# Patient Record
Sex: Female | Born: 1960 | Race: White | Hispanic: No | Marital: Married | Smoking: Never smoker
Health system: Southern US, Community
[De-identification: ages and names within clinical notes are randomized; demographics above are authoritative.]

## PROBLEM LIST (undated history)

## (undated) DIAGNOSIS — E785 Hyperlipidemia, unspecified: Secondary | ICD-10-CM

## (undated) DIAGNOSIS — R51 Headache: Secondary | ICD-10-CM

## (undated) DIAGNOSIS — K219 Gastro-esophageal reflux disease without esophagitis: Secondary | ICD-10-CM

## (undated) DIAGNOSIS — N83209 Unspecified ovarian cyst, unspecified side: Secondary | ICD-10-CM

## (undated) DIAGNOSIS — E079 Disorder of thyroid, unspecified: Secondary | ICD-10-CM

## (undated) HISTORY — DX: Hyperlipidemia, unspecified: E78.5

## (undated) HISTORY — PX: TUBAL LIGATION: SHX77

## (undated) HISTORY — DX: Unspecified ovarian cyst, unspecified side: N83.209

## (undated) HISTORY — DX: Disorder of thyroid, unspecified: E07.9

---

## 2001-04-29 ENCOUNTER — Other Ambulatory Visit: Admission: RE | Admit: 2001-04-29 | Discharge: 2001-04-29 | Payer: Self-pay | Admitting: Obstetrics and Gynecology

## 2003-04-26 ENCOUNTER — Other Ambulatory Visit: Admission: RE | Admit: 2003-04-26 | Discharge: 2003-04-26 | Payer: Self-pay | Admitting: Family Medicine

## 2004-03-08 ENCOUNTER — Other Ambulatory Visit: Admission: RE | Admit: 2004-03-08 | Discharge: 2004-03-08 | Payer: Self-pay | Admitting: Family Medicine

## 2005-03-10 ENCOUNTER — Other Ambulatory Visit: Admission: RE | Admit: 2005-03-10 | Discharge: 2005-03-10 | Payer: Self-pay | Admitting: Family Medicine

## 2012-07-20 ENCOUNTER — Other Ambulatory Visit: Payer: Self-pay

## 2012-07-20 DIAGNOSIS — Z1211 Encounter for screening for malignant neoplasm of colon: Secondary | ICD-10-CM

## 2012-07-21 ENCOUNTER — Telehealth: Payer: Self-pay

## 2012-07-21 DIAGNOSIS — Z1211 Encounter for screening for malignant neoplasm of colon: Secondary | ICD-10-CM

## 2012-07-21 MED ORDER — SOD PICOSULFATE-MAG OX-CIT ACD 10-3.5-12 MG-GM-GM PO PACK: 1.0000 | PACK | Freq: Once | ORAL | Status: DC

## 2012-07-21 NOTE — Telephone Encounter (Signed)
PREPOPIK-DRINK WATER TO KEEP URINE LIGHT YELLOW.  PT SHOULD DROP OFF RX 3 DAYS PRIOR TO PROCEDURE.  

## 2012-07-21 NOTE — Telephone Encounter (Signed)
Gastroenterology Pre-Procedure Form    Request Date: 07/20/2012    Requesting Physician: Pt just called to schedule/ Goes to [facility] in [place]     PATIENT INFORMATION:  Pamela [name] is a 52 y.o., female (DOB=03/15/1960).  PROCEDURE: Procedure(s) requested: colonoscopy Procedure Reason: screening for colon cancer  PATIENT REVIEW QUESTIONS: The patient reports the following:   1. Diabetes Melitis: no 2. Joint replacements in the past 12 months: no 3. Major health problems in the past 3 months: no 4. Has an artificial valve or MVP:no 5. Has been advised in past to take antibiotics in advance of a procedure like teeth cleaning: no}    MEDICATIONS & ALLERGIES:    Patient reports the following regarding taking any blood thinners:   Plavix? no Aspirin?no Coumadin?  no  Patient confirms/reports the following medications:  Current Outpatient Prescriptions  Medication Sig Dispense Refill  . ibuprofen (ADVIL,MOTRIN) 200 MG tablet Take 200 mg by mouth every 6 (six) hours as needed for pain.      . Multiple Vitamin (MULTIVITAMIN) tablet Take 1 tablet by mouth daily.       No current facility-administered medications for this visit.    Patient confirms/reports the following allergies:  Allergies  Allergen Reactions  . Iodine Hives    Patient is appropriate to schedule for requested procedure(s): yes  AUTHORIZATION INFORMATION Primary Insurance:   ID #:   Group #:  Pre-Cert / Auth required: Pre-Cert / Auth #:   Secondary Insurance:   ID #:   Group #:  Pre-Cert / Auth required:  Pre-Cert / Auth #:   No orders of the defined types were placed in this encounter.    SCHEDULE INFORMATION: Procedure has been scheduled as follows:  Date: 07/30/2012     Time: 3:00 AM  Location: [facility] Short Stay  This Gastroenterology Pre-Precedure Form is being routed to the following provider(s) for review: Jonette Eva, MD

## 2012-07-21 NOTE — Telephone Encounter (Signed)
Rx was sent to the pharmacy and instructions mailed to pt.  

## 2012-07-23 ENCOUNTER — Encounter (HOSPITAL_COMMUNITY): Payer: Self-pay | Admitting: Pharmacy Technician

## 2012-07-30 ENCOUNTER — Encounter (HOSPITAL_COMMUNITY): Payer: Self-pay | Admitting: *Deleted

## 2012-07-30 ENCOUNTER — Encounter (HOSPITAL_COMMUNITY)
Admission: RE | Disposition: A | Discharge status: Home / Self Care | Payer: Self-pay | Source: Ambulatory Visit | Attending: Gastroenterology

## 2012-07-30 ENCOUNTER — Ambulatory Visit (HOSPITAL_COMMUNITY)
Admission: RE | Admit: 2012-07-30 | Discharge: 2012-07-30 | Disposition: A | Discharge status: Home / Self Care | Payer: PRIVATE HEALTH INSURANCE | Source: Ambulatory Visit | Attending: Gastroenterology | Admitting: Gastroenterology

## 2012-07-30 DIAGNOSIS — K648 Other hemorrhoids: Secondary | ICD-10-CM | POA: Insufficient documentation

## 2012-07-30 DIAGNOSIS — D126 Benign neoplasm of colon, unspecified: Secondary | ICD-10-CM

## 2012-07-30 DIAGNOSIS — Z1211 Encounter for screening for malignant neoplasm of colon: Secondary | ICD-10-CM | POA: Insufficient documentation

## 2012-07-30 HISTORY — DX: Headache: R51

## 2012-07-30 HISTORY — PX: COLONOSCOPY: SHX5424

## 2012-07-30 HISTORY — DX: Gastro-esophageal reflux disease without esophagitis: K21.9

## 2012-07-30 SURGERY — COLONOSCOPY
Anesthesia: Moderate Sedation

## 2012-07-30 MED ORDER — SODIUM CHLORIDE 0.9 % IV SOLN
INTRAVENOUS | Status: DC
  Administered 2012-07-30: 12:00:00 via INTRAVENOUS

## 2012-07-30 MED ORDER — STERILE WATER FOR IRRIGATION IR SOLN
Status: DC | PRN
  Administered 2012-07-30: 13:00:00

## 2012-07-30 MED ORDER — MEPERIDINE HCL 100 MG/ML IJ SOLN
INTRAMUSCULAR | Status: DC | PRN
  Administered 2012-07-30 (×3): 25 mg via INTRAVENOUS

## 2012-07-30 MED ORDER — MEPERIDINE HCL 100 MG/ML IJ SOLN
INTRAMUSCULAR | Status: AC
  Filled 2012-07-30: qty 2

## 2012-07-30 MED ORDER — MIDAZOLAM HCL 5 MG/5ML IJ SOLN
INTRAMUSCULAR | Status: AC
  Filled 2012-07-30: qty 10

## 2012-07-30 MED ORDER — MIDAZOLAM HCL 5 MG/5ML IJ SOLN
INTRAMUSCULAR | Status: DC | PRN
  Administered 2012-07-30 (×2): 2 mg via INTRAVENOUS
  Administered 2012-07-30: 1 mg via INTRAVENOUS

## 2012-07-30 NOTE — H&P (Signed)
  Primary Care Physician:  No primary provider on file. Primary Gastroenterologist:  Dr. Darrick Penna  Pre-Procedure History & Physical: HPI:  Pamela [name] is a 52 y.o. female here for COLON CANCER SCREENING.   Past Medical History  Diagnosis Date  . GERD (gastroesophageal reflux disease)     heartburn  . Headache(784.0)     migraines    Past Surgical History  Procedure Laterality Date  . Tubal ligation      Prior to Admission medications   Medication Sig Start Date End Date Taking? Authorizing Provider  ibuprofen (ADVIL,MOTRIN) 200 MG tablet Take 200 mg by mouth every 6 (six) hours as needed for pain.   Yes Historical Provider, MD  Multiple Vitamin (MULTIVITAMIN) tablet Take 1 tablet by mouth daily.   Yes Historical Provider, MD  Sod Picosulfate-Mag Ox-Cit Acd 10-3.5-12 MG-GM-GM PACK Take 1 kit by mouth once. 07/21/12  Yes [place] Bali, MD    Allergies as of 07/20/2012  . (Not on File)    History reviewed. No pertinent family history.  History   Social History  . Marital Status: Married    Spouse Name: N/A    Number of Children: N/A  . Years of Education: N/A   Occupational History  . Not on file.   Social History Main Topics  . Smoking status: Never Smoker   . Smokeless tobacco: Not on file  . Alcohol Use: No  . Drug Use: No  . Sexually Active: Yes   Other Topics Concern  . Not on file   Social History Narrative  . No narrative on file    Review of Systems: See HPI, otherwise negative ROS   Physical Exam: BP 147/92  Pulse 90  Temp(Src) 97.6 F (36.4 C) (Oral)  Resp 12  Ht 5\' 1"  (1.549 m)  Wt 150 lb (68.04 kg)  BMI 28.36 kg/m2 General:   Alert,  pleasant and cooperative in NAD Head:  Normocephalic and atraumatic. Neck:  Supple; Lungs:  Clear throughout to auscultation.    Heart:  Regular rate and rhythm. Abdomen:  Soft, nontender and nondistended. Normal bowel sounds, without guarding, and without rebound.   Neurologic:  Alert and  oriented x4;   grossly normal neurologically.  Impression/Plan:     SCREENING  Plan:  1. TCS TODAY

## 2012-07-30 NOTE — Op Note (Addendum)
[facility] [address] [place], [address]   COLONOSCOPY PROCEDURE REPORT  PATIENT: Pamela, [name]  MR#: [id number] BIRTHDATE: 1960-07-05 , 52  yrs. old GENDER: Female ENDOSCOPIST: Jonette Eva, MD REFERRED IO:NGEX Sheron Nightingale, N.P. PROCEDURE DATE:  07/30/2012 PROCEDURE:   Colonoscopy with snare polypectomy INDICATIONS:Average risk patient for colon cancer. MEDICATIONS: Demerol 75 mg IV and Versed 5 mg IV  DESCRIPTION OF PROCEDURE:    Physical exam was performed.  Informed consent was obtained from the patient after explaining the benefits, risks, and alternatives to procedure.  The patient was connected to monitor and placed in left lateral position. Continuous oxygen was provided by nasal cannula and IV medicine administered through an indwelling cannula.  After administration of sedation and rectal exam, the patients rectum was intubated and the EC-[id number] ([id number])  colonoscope was advanced under direct visualization to the ileum.  The scope was removed slowly by carefully examining the color, texture, anatomy, and integrity mucosa on the way out.  The patient was recovered in endoscopy and discharged home in satisfactory condition.    COLON FINDINGS: The mucosa appeared normal in the terminal ileum.  , Three sessile polyps measuring 4-6 mm in size were found in the ascending colon, at the hepatic flexure, and in the transverse colon.  A polypectomy was performed using snare cautery.  The resection was complete and the polyp tissue was completely retrieved, Small internal hemorrhoids were found.  , and The colon was otherwise normal.  There was no diverticulosis, inflammation, or cancers unless previously stated.  PREP QUALITY: good.    CECAL W/D TIME: 15 minutes  COMPLICATIONS: None  ENDOSCOPIC IMPRESSION: 1.   Normal mucosa in the terminal ileum 2.   Three COLON polyps REMOVED 3.   Small internal hemorrhoids  RECOMMENDATIONS: AWAIT BIOPSY HIGH  FIBER DIET TCS IN 3 YEARS IF 3 SIMPLE ADENOMAS AND 5-10 YEARS IF 1-2 SIMPLE ADENOMAS       _______________________________ Rosalie DoctorJonette Eva, MD 08/04/2012 2:48 AM Revised: 08/04/2012 2:48 AM    PATIENT NAME:  Pamela, [name] MR#: [id number]

## 2012-08-04 ENCOUNTER — Encounter (HOSPITAL_COMMUNITY): Payer: Self-pay | Admitting: Gastroenterology

## 2012-08-04 ENCOUNTER — Telehealth: Payer: Self-pay | Admitting: Gastroenterology

## 2012-08-04 NOTE — Telephone Encounter (Signed)
No PCP on File 

## 2012-08-04 NOTE — Telephone Encounter (Signed)
Please call pt. She had simple adenomas removed from her colon.   FOLLOW A HIGH FIBER DIET.  NEXT COLONOSCOPY IN 3 YEARS.

## 2012-08-04 NOTE — Telephone Encounter (Signed)
Called. Many rings and no answer.  

## 2012-08-06 NOTE — Telephone Encounter (Signed)
Letter mailed to pt to call for results.  

## 2012-08-09 ENCOUNTER — Telehealth: Payer: Self-pay | Admitting: Gastroenterology

## 2012-08-09 NOTE — Telephone Encounter (Signed)
Pt called to speak with nurse. She received a letter regarding her results. Please call patient back at [phone]

## 2012-08-09 NOTE — Telephone Encounter (Signed)
See results of 08/04/2012. Pt informed.

## 2012-08-10 ENCOUNTER — Ambulatory Visit (INDEPENDENT_AMBULATORY_CARE_PROVIDER_SITE_OTHER): Payer: PRIVATE HEALTH INSURANCE | Admitting: Nurse Practitioner

## 2012-08-10 ENCOUNTER — Encounter: Payer: Self-pay | Admitting: Nurse Practitioner

## 2012-08-10 VITALS — BP 139/92 | HR 91 | Temp 97.1°F | Ht 60.0 in | Wt 171.0 lb

## 2012-08-10 DIAGNOSIS — N390 Urinary tract infection, site not specified: Secondary | ICD-10-CM

## 2012-08-10 DIAGNOSIS — Z0000 Encounter for general adult medical examination without abnormal findings: Secondary | ICD-10-CM

## 2012-08-10 DIAGNOSIS — Z01419 Encounter for gynecological examination (general) (routine) without abnormal findings: Secondary | ICD-10-CM

## 2012-08-10 LAB — POCT CBC
Granulocyte percent: 70.8 %G (ref 37–80)
HCT, POC: 32.3 % — AB (ref 37.7–47.9)
Hemoglobin: 11.7 g/dL — AB (ref 12.2–16.2)
Lymph, poc: 1.2 (ref 0.6–3.4)
MCH, POC: 31.8 pg — AB (ref 27–31.2)
MCHC: 36.2 g/dL — AB (ref 31.8–35.4)
MCV: 87.9 fL (ref 80–97)
MPV: 7.9 fL (ref 0–99.8)
POC Granulocyte: 3.8 (ref 2–6.9)
POC LYMPH PERCENT: 23 %L (ref 10–50)
Platelet Count, POC: 224 10*3/uL (ref 142–424)
RBC: 3.7 M/uL — AB (ref 4.04–5.48)
RDW, POC: 13.2 %
WBC: 5.3 10*3/uL (ref 4.6–10.2)

## 2012-08-10 LAB — POCT UA - MICROSCOPIC ONLY
Casts, Ur, LPF, POC: NEGATIVE
Crystals, Ur, HPF, POC: NEGATIVE
Mucus, UA: NEGATIVE
Yeast, UA: NEGATIVE

## 2012-08-10 LAB — POCT URINALYSIS DIPSTICK
Bilirubin, UA: NEGATIVE
Glucose, UA: NEGATIVE
Ketones, UA: NEGATIVE
Nitrite, UA: NEGATIVE
Spec Grav, UA: 1.025
Urobilinogen, UA: NEGATIVE
pH, UA: 5

## 2012-08-10 NOTE — Patient Instructions (Signed)

## 2012-08-10 NOTE — Progress Notes (Signed)
  Subjective:    Patient ID: Pamela [name], female    DOB: 1960-09-19, 52 y.o.   MRN: [id number]  HPI Patient in today for a CPE and PAP- SHe has no current medical problems and is on no meds- No complaints today.    Review of Systems  All other systems reviewed and are negative.       Objective:   Physical Exam  Constitutional: She is oriented to person, place, and time. She appears well-developed and well-nourished.  HENT:  Head: Normocephalic.  Right Ear: Hearing, tympanic membrane, external ear and ear canal normal.  Left Ear: Hearing, tympanic membrane, external ear and ear canal normal.  Nose: Nose normal.  Mouth/Throat: Uvula is midline and oropharynx is clear and moist.  Eyes: Conjunctivae and EOM are normal. Pupils are equal, round, and reactive to light.  Neck: Normal range of motion and full passive range of motion without pain. Neck supple. No JVD present. Carotid bruit is not present. No mass and no thyromegaly present.  Cardiovascular: Normal rate, normal heart sounds and intact distal pulses.   No murmur heard. Pulmonary/Chest: Effort normal and breath sounds normal. Right breast exhibits no inverted nipple, no mass, no nipple discharge, no skin change and no tenderness. Left breast exhibits no inverted nipple, no mass, no nipple discharge, no skin change and no tenderness.  Abdominal: Soft. Bowel sounds are normal. She exhibits no mass. There is no tenderness.  Genitourinary: Vagina normal and uterus normal. No breast swelling, tenderness, discharge or bleeding.  bimanual exam-No adnexal masses or tenderness. Cervix parous and pink  Musculoskeletal: Normal range of motion.  Lymphadenopathy:    She has no cervical adenopathy.  Neurological: She is alert and oriented to person, place, and time.  Skin: Skin is warm and dry.  Psychiatric: She has a normal mood and affect. Her behavior is normal. Judgment and thought content normal.   BP 139/92  Pulse 91  Temp(Src) 97.1  F (36.2 C) (Oral)  Ht 5' (1.524 m)  Wt 171 lb (77.565 kg)  BMI 33.4 kg/m2        Assessment & Plan:   1. Annual physical exam   2. Encounter for routine gynecological examination    Orders Placed This Encounter  Procedures  . COMPLETE METABOLIC PANEL WITH GFR  . NMR Lipoprofile with Lipids  . Thyroid Panel With TSH  . POCT UA - Microscopic Only  . POCT CBC   Diet and exercise encouraged Labs pending RTO prn  Mary-Margaret Daphine Deutscher, FNP

## 2012-08-11 LAB — COMPLETE METABOLIC PANEL WITH GFR
ALT: 27 U/L (ref 0–35)
AST: 22 U/L (ref 0–37)
Albumin: 3.9 g/dL (ref 3.5–5.2)
Alkaline Phosphatase: 80 U/L (ref 39–117)
BUN: 12 mg/dL (ref 6–23)
CO2: 28 mEq/L (ref 19–32)
Calcium: 9.2 mg/dL (ref 8.4–10.5)
Chloride: 105 mEq/L (ref 96–112)
Creat: 0.66 mg/dL (ref 0.50–1.10)
GFR, Est African American: >89 mL/min
GFR, Est Non African American: >89 mL/min
Glucose, Bld: 72 mg/dL (ref 70–99)
Potassium: 3.2 mEq/L — ABNORMAL LOW (ref 3.5–5.3)
Sodium: 142 mEq/L (ref 135–145)
Total Bilirubin: 0.4 mg/dL (ref 0.3–1.2)
Total Protein: 6.2 g/dL (ref 6.0–8.3)

## 2012-08-11 LAB — NMR LIPOPROFILE WITH LIPIDS
Cholesterol, Total: 180 mg/dL (ref <200)
HDL Particle Number: 30.3 umol/L — ABNORMAL LOW (ref >=30.5)
HDL Size: 9.2 nm (ref >=9.2)
HDL-C: 54 mg/dL (ref >=40)
LDL (calc): 109 mg/dL — ABNORMAL HIGH (ref <100)
LDL Particle Number: 1327 nmol/L — ABNORMAL HIGH (ref <1000)
LDL Size: 21.2 nm (ref >20.5)
LP-IR Score: 30 (ref <=45)
Large HDL-P: 8.2 umol/L (ref >=4.8)
Large VLDL-P: 2.2 nmol/L (ref <=2.7)
Small LDL Particle Number: 336 nmol/L (ref <=527)
Triglycerides: 87 mg/dL (ref <150)
VLDL Size: 43.6 nm (ref <=46.6)

## 2012-08-11 LAB — PAP IG, CT-NG, RFX HPV ASCU
Chlamydia Probe Amp: NEGATIVE
GC Probe Amp: NEGATIVE

## 2012-08-11 LAB — THYROID PANEL WITH TSH
Free Thyroxine Index: 3.2 (ref 1.0–3.9)
T3 Uptake: 37.4 % — ABNORMAL HIGH (ref 22.5–37.0)
T4, Total: 8.5 ug/dL (ref 5.0–12.5)
TSH: 3.914 u[IU]/mL (ref 0.350–4.500)

## 2012-08-12 ENCOUNTER — Other Ambulatory Visit: Payer: Self-pay | Admitting: General Practice

## 2012-08-12 DIAGNOSIS — E876 Hypokalemia: Secondary | ICD-10-CM

## 2012-08-12 MED ORDER — POTASSIUM CHLORIDE CRYS ER 10 MEQ PO TBCR: 10.0000 meq | EXTENDED_RELEASE_TABLET | Freq: Two times a day (BID) | ORAL | Status: DC

## 2012-08-20 ENCOUNTER — Ambulatory Visit (INDEPENDENT_AMBULATORY_CARE_PROVIDER_SITE_OTHER): Payer: PRIVATE HEALTH INSURANCE | Admitting: Family Medicine

## 2012-08-20 DIAGNOSIS — Z23 Encounter for immunization: Secondary | ICD-10-CM

## 2012-08-20 DIAGNOSIS — R7989 Other specified abnormal findings of blood chemistry: Secondary | ICD-10-CM

## 2012-08-20 LAB — BASIC METABOLIC PANEL WITH GFR
BUN: 10 mg/dL (ref 6–23)
CO2: 29 mEq/L (ref 19–32)
Calcium: 10 mg/dL (ref 8.4–10.5)
Chloride: 106 mEq/L (ref 96–112)
Creat: 0.76 mg/dL (ref 0.50–1.10)
GFR, Est African American: >89 mL/min
GFR, Est Non African American: >89 mL/min
Glucose, Bld: 99 mg/dL (ref 70–99)
Potassium: 3.8 mEq/L (ref 3.5–5.3)
Sodium: 141 mEq/L (ref 135–145)

## 2012-08-24 ENCOUNTER — Telehealth: Payer: Self-pay | Admitting: Nurse Practitioner

## 2012-08-24 NOTE — Telephone Encounter (Signed)
Pt aware by detailed mess on Vm

## 2014-12-18 ENCOUNTER — Encounter: Payer: Self-pay | Admitting: Family

## 2014-12-18 ENCOUNTER — Ambulatory Visit (INDEPENDENT_AMBULATORY_CARE_PROVIDER_SITE_OTHER): Payer: BLUE CROSS/BLUE SHIELD | Admitting: Family

## 2014-12-18 VITALS — BP 148/94 | HR 74 | Temp 97.9°F | Ht 60.0 in | Wt 173.0 lb

## 2014-12-18 DIAGNOSIS — Z0000 Encounter for general adult medical examination without abnormal findings: Secondary | ICD-10-CM

## 2014-12-18 DIAGNOSIS — I10 Essential (primary) hypertension: Secondary | ICD-10-CM

## 2014-12-18 DIAGNOSIS — Z23 Encounter for immunization: Secondary | ICD-10-CM | POA: Diagnosis not present

## 2014-12-18 DIAGNOSIS — Z01419 Encounter for gynecological examination (general) (routine) without abnormal findings: Secondary | ICD-10-CM | POA: Diagnosis not present

## 2014-12-18 LAB — POCT URINALYSIS DIPSTICK
Bilirubin, UA: NEGATIVE
Glucose, UA: NEGATIVE
Ketones, UA: NEGATIVE
Leukocytes, UA: NEGATIVE
Nitrite, UA: NEGATIVE
Protein, UA: NEGATIVE
Spec Grav, UA: 1.015
Urobilinogen, UA: NEGATIVE
pH, UA: 6

## 2014-12-18 LAB — POCT UA - MICROSCOPIC ONLY
Casts, Ur, LPF, POC: NEGATIVE
Crystals, Ur, HPF, POC: NEGATIVE
WBC, Ur, HPF, POC: NEGATIVE
Yeast, UA: NEGATIVE

## 2014-12-18 MED ORDER — HYDROCHLOROTHIAZIDE 25 MG PO TABS: 25.0000 mg | ORAL_TABLET | Freq: Every day | ORAL | Status: DC

## 2014-12-18 NOTE — Progress Notes (Signed)
Subjective:    Patient ID: Pamela [name], female    DOB: [birth year], 54 y.o.   MRN: [id number]  HPI Pt presents to the office today for CPE with pap. Pt currently not taking any chronic medications. Pt's BP is elevated today. Pt states her BP has never been elevated before, but she is anxious today about her pap. Pt denies any headache, palpitations, SOB, or edema at this time.     Review of Systems  Constitutional: Negative.   HENT: Negative.   Eyes: Negative.   Respiratory: Negative.  Negative for shortness of breath.   Cardiovascular: Negative.  Negative for palpitations.  Gastrointestinal: Negative.   Endocrine: Negative.   Genitourinary: Negative.   Musculoskeletal: Negative.   Neurological: Negative.  Negative for headaches.  Hematological: Negative.   Psychiatric/Behavioral: Negative.   All other systems reviewed and are negative.      Objective:   Physical Exam  Constitutional: She is oriented to person, place, and time. She appears well-developed and well-nourished. No distress.  HENT:  Head: Normocephalic and atraumatic.  Right Ear: External ear normal.  Left Ear: External ear normal.  Nose: Nose normal.  Mouth/Throat: Oropharynx is clear and moist.  Eyes: Pupils are equal, round, and reactive to [name].  Neck: Normal range of motion. Neck supple. No thyromegaly present.  Cardiovascular: Normal rate, regular rhythm, normal heart sounds and intact distal pulses.   No murmur heard. Pulmonary/Chest: Effort normal and breath sounds normal. No respiratory distress. She has no wheezes. Right breast exhibits no inverted nipple, no mass, no nipple discharge, no skin change and no tenderness. Left breast exhibits no inverted nipple, no mass, no nipple discharge, no skin change and no tenderness. Breasts are symmetrical.  Abdominal: Soft. Bowel sounds are normal. She exhibits no distension. There is no tenderness.  Genitourinary: Vagina normal.  Bimanual exam- no adnexal  masses or tenderness, ovaries nonpalpable   Cervix parous and pink- No discharge   Musculoskeletal: Normal range of motion. She exhibits no edema or tenderness.  Neurological: She is alert and oriented to person, place, and time. She has normal reflexes. No cranial nerve deficit.  Skin: Skin is warm and dry.  Psychiatric: She has a normal mood and affect. Her behavior is normal. Judgment and thought content normal.  Vitals reviewed.   BP 148/94 mmHg  Pulse 74  Temp(Src) 97.9 F (36.6 C) (Oral)  Ht 5' (1.524 m)  Wt 173 lb (78.472 kg)  BMI 33.79 kg/m2      Assessment & Plan:  1. Encounter for routine gynecological examination - POCT urinalysis dipstick - POCT UA - Microscopic Only - Pap IG w/ reflex to HPV when ASC-U  2. Annual physical exam - Anemia Profile B - CMP14+EGFR - Lipid panel - Thyroid Panel With TSH - Vit D  25 hydroxy (rtn osteoporosis monitoring) - Pap IG w/ reflex to HPV when ASC-U  3. Essential hypertension -Pt started on HCTZ 25 mg today -Daily blood pressure log given with instructions on how to fill out and told to bring to next visit -Dash diet information given -Exercise encouraged - Stress Management  -Continue current meds -RTO in 2 weeks  - hydrochlorothiazide (HYDRODIURIL) 25 MG tablet; Take 1 tablet (25 mg total) by mouth daily.  Dispense: 90 tablet; Refill: 3   Continue all meds Labs pending Health Maintenance reviewed- Flu vaccine given today, pt has mammogram scheduled this month Diet and exercise encouraged RTO 2 weeks to recheck HTN  Evelina Dun, FNP

## 2014-12-18 NOTE — Patient Instructions (Signed)

## 2014-12-19 ENCOUNTER — Other Ambulatory Visit: Payer: Self-pay | Admitting: Family

## 2014-12-19 DIAGNOSIS — E039 Hypothyroidism, unspecified: Secondary | ICD-10-CM

## 2014-12-19 DIAGNOSIS — I10 Essential (primary) hypertension: Secondary | ICD-10-CM

## 2014-12-19 DIAGNOSIS — E559 Vitamin D deficiency, unspecified: Secondary | ICD-10-CM

## 2014-12-19 DIAGNOSIS — E785 Hyperlipidemia, unspecified: Secondary | ICD-10-CM

## 2014-12-19 LAB — CMP14+EGFR
ALT: 35 IU/L — ABNORMAL HIGH (ref 0–32)
AST: 27 IU/L (ref 0–40)
Albumin/Globulin Ratio: 1.5 (ref 1.1–2.5)
Albumin: 4.1 g/dL (ref 3.5–5.5)
Alkaline Phosphatase: 85 IU/L (ref 39–117)
BUN/Creatinine Ratio: 14 (ref 9–23)
BUN: 10 mg/dL (ref 6–24)
Bilirubin Total: 0.3 mg/dL (ref 0.0–1.2)
CO2: 28 mmol/L (ref 18–29)
Calcium: 9.3 mg/dL (ref 8.7–10.2)
Chloride: 102 mmol/L (ref 97–106)
Creatinine, Ser: 0.74 mg/dL (ref 0.57–1.00)
GFR calc Af Amer: 106 mL/min/{1.73_m2} (ref >59)
GFR calc non Af Amer: 92 mL/min/{1.73_m2} (ref >59)
Globulin, Total: 2.8 g/dL (ref 1.5–4.5)
Glucose: 95 mg/dL (ref 65–99)
Potassium: 4 mmol/L (ref 3.5–5.2)
Sodium: 143 mmol/L (ref 136–144)
Total Protein: 6.9 g/dL (ref 6.0–8.5)

## 2014-12-19 LAB — ANEMIA PROFILE B
Basophils Absolute: 0.1 10*3/uL (ref 0.0–0.2)
Basos: 1 %
EOS (ABSOLUTE): 0.3 10*3/uL (ref 0.0–0.4)
Eos: 5 %
Ferritin: 105 ng/mL (ref 15–150)
Folate: 20 ng/mL (ref >3.0)
Hematocrit: 42.1 % (ref 34.0–46.6)
Hemoglobin: 14 g/dL (ref 11.1–15.9)
Immature Grans (Abs): 0 10*3/uL (ref 0.0–0.1)
Immature Granulocytes: 0 %
Iron Saturation: 26 % (ref 15–55)
Iron: 72 ug/dL (ref 27–159)
Lymphocytes Absolute: 1 10*3/uL (ref 0.7–3.1)
Lymphs: 19 %
MCH: 30.4 pg (ref 26.6–33.0)
MCHC: 33.3 g/dL (ref 31.5–35.7)
MCV: 91 fL (ref 79–97)
Monocytes Absolute: 0.3 10*3/uL (ref 0.1–0.9)
Monocytes: 6 %
Neutrophils Absolute: 3.7 10*3/uL (ref 1.4–7.0)
Neutrophils: 69 %
Platelets: 217 10*3/uL (ref 150–379)
RBC: 4.61 x10E6/uL (ref 3.77–5.28)
RDW: 13.4 % (ref 12.3–15.4)
Retic Ct Pct: 1.1 % (ref 0.6–2.6)
Total Iron Binding Capacity: 282 ug/dL (ref 250–450)
UIBC: 210 ug/dL (ref 131–425)
Vitamin B-12: 436 pg/mL (ref 211–946)
WBC: 5.3 10*3/uL (ref 3.4–10.8)

## 2014-12-19 LAB — VITAMIN D 25 HYDROXY (VIT D DEFICIENCY, FRACTURES): Vit D, 25-Hydroxy: 23.2 ng/mL — ABNORMAL LOW (ref 30.0–100.0)

## 2014-12-19 LAB — LIPID PANEL
Chol/HDL Ratio: 4.5 ratio units — ABNORMAL HIGH (ref 0.0–4.4)
Cholesterol, Total: 254 mg/dL — ABNORMAL HIGH (ref 100–199)
HDL: 56 mg/dL (ref >39)
LDL Calculated: 165 mg/dL — ABNORMAL HIGH (ref 0–99)
Triglycerides: 164 mg/dL — ABNORMAL HIGH (ref 0–149)
VLDL Cholesterol Cal: 33 mg/dL (ref 5–40)

## 2014-12-19 LAB — THYROID PANEL WITH TSH
Free Thyroxine Index: 1.8 (ref 1.2–4.9)
T3 Uptake Ratio: 24 % (ref 24–39)
T4, Total: 7.6 ug/dL (ref 4.5–12.0)
TSH: 4.57 u[IU]/mL — ABNORMAL HIGH (ref 0.450–4.500)

## 2014-12-19 MED ORDER — VITAMIN D (ERGOCALCIFEROL) 1.25 MG (50000 UNIT) PO CAPS: 50000.0000 [IU] | ORAL_CAPSULE | ORAL | Status: DC

## 2014-12-19 MED ORDER — ATORVASTATIN CALCIUM 20 MG PO TABS: 20.0000 mg | ORAL_TABLET | Freq: Every day | ORAL | Status: DC

## 2014-12-19 MED ORDER — LEVOTHYROXINE SODIUM 25 MCG PO TABS: 25.0000 ug | ORAL_TABLET | Freq: Every day | ORAL | Status: DC

## 2014-12-19 NOTE — Progress Notes (Signed)
PATIENT AWARE. WILL PICK UP RXS

## 2014-12-20 LAB — PAP IG W/ RFLX HPV ASCU: PAP Smear Comment: 0

## 2014-12-21 ENCOUNTER — Telehealth: Payer: Self-pay | Admitting: Family

## 2014-12-21 NOTE — Telephone Encounter (Signed)
Reviewed pap results.

## 2015-01-01 ENCOUNTER — Encounter: Payer: Self-pay | Admitting: Family

## 2015-01-01 ENCOUNTER — Ambulatory Visit (INDEPENDENT_AMBULATORY_CARE_PROVIDER_SITE_OTHER): Payer: BLUE CROSS/BLUE SHIELD | Admitting: Family

## 2015-01-01 ENCOUNTER — Encounter (INDEPENDENT_AMBULATORY_CARE_PROVIDER_SITE_OTHER): Payer: Self-pay

## 2015-01-01 VITALS — BP 116/77 | HR 73 | Temp 98.0°F | Ht 60.0 in | Wt 172.4 lb

## 2015-01-01 DIAGNOSIS — I10 Essential (primary) hypertension: Secondary | ICD-10-CM | POA: Diagnosis not present

## 2015-01-01 DIAGNOSIS — G5603 Carpal tunnel syndrome, bilateral upper limbs: Secondary | ICD-10-CM | POA: Diagnosis not present

## 2015-01-01 NOTE — Patient Instructions (Addendum)

## 2015-01-01 NOTE — Progress Notes (Signed)
   Subjective:    Patient ID: Pamela [name], female    DOB: 1961-01-03, 54 y.o.   MRN: [id number]  Pt presents to the office today to recheck HTN. Pt's BP is at goal today! Pt is also complaining of intermittent bilateral hand and arm "tingling and numbness". PT states she does repetitive   motion at work and noticed this pain about a month ago. Hypertension This is a chronic problem. The current episode started more than 1 year ago. The problem has been resolved since onset. The problem is controlled. Pertinent negatives include no anxiety, headaches, palpitations, peripheral edema or shortness of breath. Risk factors for coronary artery disease include dyslipidemia, obesity, post-menopausal state, sedentary lifestyle and family history. Past treatments include diuretics. The current treatment provides significant improvement. There is no history of kidney disease, CAD/MI, CVA, heart failure or a thyroid problem. There is no history of sleep apnea.      Review of Systems  Constitutional: Negative.   HENT: Negative.   Eyes: Negative.   Respiratory: Negative.  Negative for shortness of breath.   Cardiovascular: Negative.  Negative for palpitations.  Gastrointestinal: Negative.   Endocrine: Negative.   Genitourinary: Negative.   Musculoskeletal: Negative.   Neurological: Negative.  Negative for headaches.  Hematological: Negative.   Psychiatric/Behavioral: Negative.   All other systems reviewed and are negative.      Objective:   Physical Exam  Constitutional: She is oriented to person, place, and time. She appears well-developed and well-nourished. No distress.  HENT:  Head: Normocephalic and atraumatic.  Right Ear: External ear normal.  Left Ear: External ear normal.  Nose: Nose normal.  Mouth/Throat: Oropharynx is clear and moist.  Eyes: Pupils are equal, round, and reactive to [name].  Neck: Normal range of motion. Neck supple. No thyromegaly present.  Cardiovascular: Normal rate,  regular rhythm, normal heart sounds and intact distal pulses.   No murmur heard. Pulmonary/Chest: Effort normal and breath sounds normal. No respiratory distress. She has no wheezes.  Abdominal: Soft. Bowel sounds are normal. She exhibits no distension. There is no tenderness.  Musculoskeletal: Normal range of motion. She exhibits no edema or tenderness.  Neurological: She is alert and oriented to person, place, and time. She has normal reflexes. No cranial nerve deficit.  Skin: Skin is warm and dry.  Psychiatric: She has a normal mood and affect. Her behavior is normal. Judgment and thought content normal.  Vitals reviewed.     BP 116/77 mmHg  Pulse 73  Temp(Src) 98 F (36.7 C) (Oral)  Ht 5' (1.524 m)  Wt 172 lb 6.4 oz (78.2 kg)  BMI 33.67 kg/m2     Assessment & Plan:  1. Essential hypertension -Dash diet information given -Exercise encouraged - Stress Management  -Continue current meds - BMP8+EGFR  2. Bilateral carpal tunnel syndrome -PT to wear bilateral wrist brace while working and told pt to wear at night and take motrin for next 7 days until inflammation is resolved -Ice as needed -RTO prn     Continue all meds Labs pending Health Maintenance reviewed- Pt is getting mammogram done today Diet and exercise encouraged RTO 2 months to recheck Thyroid, Cholesterol, and HTN  Evelina Dun, FNP

## 2015-01-02 ENCOUNTER — Other Ambulatory Visit: Payer: Self-pay | Admitting: Family

## 2015-01-02 DIAGNOSIS — E876 Hypokalemia: Secondary | ICD-10-CM

## 2015-01-02 LAB — BMP8+EGFR
BUN/Creatinine Ratio: 18 (ref 9–23)
BUN: 14 mg/dL (ref 6–24)
CO2: 31 mmol/L — ABNORMAL HIGH (ref 18–29)
Calcium: 9.4 mg/dL (ref 8.7–10.2)
Chloride: 95 mmol/L — ABNORMAL LOW (ref 97–106)
Creatinine, Ser: 0.76 mg/dL (ref 0.57–1.00)
GFR calc Af Amer: 103 mL/min/{1.73_m2} (ref >59)
GFR calc non Af Amer: 89 mL/min/{1.73_m2} (ref >59)
Glucose: 88 mg/dL (ref 65–99)
Potassium: 2.7 mmol/L — ABNORMAL LOW (ref 3.5–5.2)
Sodium: 142 mmol/L (ref 136–144)

## 2015-01-02 MED ORDER — POTASSIUM CHLORIDE ER 10 MEQ PO TBCR: 10.0000 meq | EXTENDED_RELEASE_TABLET | Freq: Every day | ORAL | Status: DC

## 2015-01-03 ENCOUNTER — Telehealth: Payer: Self-pay | Admitting: Family

## 2015-01-03 DIAGNOSIS — E876 Hypokalemia: Secondary | ICD-10-CM

## 2015-01-03 NOTE — Telephone Encounter (Signed)
Pt aware of recent lab - BMP

## 2015-01-05 ENCOUNTER — Other Ambulatory Visit: Payer: PRIVATE HEALTH INSURANCE | Admitting: Nurse Practitioner

## 2015-01-08 ENCOUNTER — Other Ambulatory Visit: Payer: PRIVATE HEALTH INSURANCE | Admitting: Nurse Practitioner

## 2015-02-02 ENCOUNTER — Other Ambulatory Visit: Payer: BLUE CROSS/BLUE SHIELD

## 2015-02-02 DIAGNOSIS — E876 Hypokalemia: Secondary | ICD-10-CM

## 2015-02-02 NOTE — Progress Notes (Signed)
Lab only 

## 2015-02-03 LAB — BMP8+EGFR
BUN/Creatinine Ratio: 15 (ref 9–23)
BUN: 11 mg/dL (ref 6–24)
CO2: 30 mmol/L — ABNORMAL HIGH (ref 18–29)
Calcium: 8.8 mg/dL (ref 8.7–10.2)
Chloride: 100 mmol/L (ref 96–106)
Creatinine, Ser: 0.73 mg/dL (ref 0.57–1.00)
GFR calc Af Amer: 108 mL/min/{1.73_m2} (ref >59)
GFR calc non Af Amer: 94 mL/min/{1.73_m2} (ref >59)
Glucose: 121 mg/dL — ABNORMAL HIGH (ref 65–99)
Potassium: 2.9 mmol/L — ABNORMAL LOW (ref 3.5–5.2)
Sodium: 142 mmol/L (ref 134–144)

## 2015-02-07 ENCOUNTER — Telehealth: Payer: Self-pay | Admitting: Family

## 2015-02-07 NOTE — Telephone Encounter (Signed)
Patient aware of instructions for taking potassium and repeating labs.

## 2015-02-13 ENCOUNTER — Other Ambulatory Visit: Payer: BLUE CROSS/BLUE SHIELD

## 2015-02-13 DIAGNOSIS — R799 Abnormal finding of blood chemistry, unspecified: Secondary | ICD-10-CM

## 2015-02-14 LAB — BMP8+EGFR
BUN/Creatinine Ratio: 16 (ref 9–23)
BUN: 11 mg/dL (ref 6–24)
CO2: 26 mmol/L (ref 18–29)
Calcium: 9.4 mg/dL (ref 8.7–10.2)
Chloride: 98 mmol/L (ref 96–106)
Creatinine, Ser: 0.67 mg/dL (ref 0.57–1.00)
GFR calc Af Amer: 115 mL/min/{1.73_m2} (ref >59)
GFR calc non Af Amer: 100 mL/min/{1.73_m2} (ref >59)
Glucose: 78 mg/dL (ref 65–99)
Potassium: 2.8 mmol/L — ABNORMAL LOW (ref 3.5–5.2)
Sodium: 143 mmol/L (ref 134–144)

## 2015-02-15 ENCOUNTER — Other Ambulatory Visit: Payer: Self-pay | Admitting: *Deleted

## 2015-02-15 ENCOUNTER — Telehealth: Payer: Self-pay | Admitting: Family

## 2015-02-15 DIAGNOSIS — E876 Hypokalemia: Secondary | ICD-10-CM

## 2015-02-15 MED ORDER — POTASSIUM CHLORIDE ER 10 MEQ PO TBCR: 40.0000 meq | EXTENDED_RELEASE_TABLET | Freq: Every day | ORAL | Status: DC

## 2015-02-15 NOTE — Telephone Encounter (Signed)
Patient is taking potassium 94meq 2 pills BID. Patients potassium was 2.8

## 2015-02-15 NOTE — Telephone Encounter (Signed)
Reduce hydrochlorothiazide to 12.5 daily Continue potassium 10 mEq 2 twice daily for total of 40 mEq daily Return to clinic in 1 week and recheck blood pressure and BMP May need to change to spironolactone instead of having to take fluid pill and potassium pills at that time

## 2015-02-15 NOTE — Telephone Encounter (Signed)
Reduce HCTZ to 1/2 - follow up in 1 week with Alyse Low - pt aware

## 2015-02-23 ENCOUNTER — Encounter: Payer: Self-pay | Admitting: Family

## 2015-02-23 ENCOUNTER — Ambulatory Visit (INDEPENDENT_AMBULATORY_CARE_PROVIDER_SITE_OTHER): Payer: BLUE CROSS/BLUE SHIELD | Admitting: Family

## 2015-02-23 VITALS — BP 118/80 | HR 70 | Temp 97.3°F | Ht 60.0 in | Wt 175.0 lb

## 2015-02-23 DIAGNOSIS — I10 Essential (primary) hypertension: Secondary | ICD-10-CM

## 2015-02-23 DIAGNOSIS — E876 Hypokalemia: Secondary | ICD-10-CM | POA: Diagnosis not present

## 2015-02-23 NOTE — Progress Notes (Signed)
   Subjective:    Patient ID: Pamela [name], female    DOB: 1961-02-08, 55 y.o.   MRN: [id number]  HPI Pt presents to the office today to recheck K+. Pt's K+ was 2.7 on 01/01/15 and was started on Potassium oral. Pt's K+ was rechecked on 02/02/15 and her K+ was 2.9. Her potassium increased to 40 meq daily.  Then her K+ was 2.8 on 02/13/15 and pt was to continue  Potassium chloride 40 meq daily and her HCTZ was decreased from 25 mg to 12.5 mg. PT states she doing well and denies any palpitations, SOB, or edema at this time.  Pt's BP is WNL today.    Review of Systems  Constitutional: Negative.   HENT: Negative.   Eyes: Negative.   Respiratory: Negative.  Negative for shortness of breath.   Cardiovascular: Negative.  Negative for palpitations.  Gastrointestinal: Negative.   Endocrine: Negative.   Genitourinary: Negative.   Musculoskeletal: Negative.   Neurological: Negative.  Negative for headaches.  Hematological: Negative.   Psychiatric/Behavioral: Negative.   All other systems reviewed and are negative.      Objective:   Physical Exam  Constitutional: She is oriented to person, place, and time. She appears well-developed and well-nourished. No distress.  HENT:  Head: Normocephalic and atraumatic.  Right Ear: External ear normal.  Left Ear: External ear normal.  Nose: Nose normal.  Mouth/Throat: Oropharynx is clear and moist.  Eyes: Pupils are equal, round, and reactive to [name].  Neck: Normal range of motion. Neck supple. No thyromegaly present.  Cardiovascular: Normal rate, regular rhythm, normal heart sounds and intact distal pulses.   No murmur heard. Pulmonary/Chest: Effort normal and breath sounds normal. No respiratory distress. She has no wheezes.  Abdominal: Soft. Bowel sounds are normal. She exhibits no distension. There is no tenderness.  Musculoskeletal: Normal range of motion. She exhibits no edema or tenderness.  Neurological: She is alert and oriented to person,  place, and time. She has normal reflexes. No cranial nerve deficit.  Skin: Skin is warm and dry.  Psychiatric: She has a normal mood and affect. Her behavior is normal. Judgment and thought content normal.  Vitals reviewed.     BP 118/80 mmHg  Pulse 70  Temp(Src) 97.3 F (36.3 C) (Oral)  Ht 5' (1.524 m)  Wt 175 lb (79.379 kg)  BMI 34.18 kg/m2     Assessment & Plan:  1. Hypokalemia Lab pending -Eat diet rich in potassium Continue 40 meq potassium chloride daily - BMP8+EGFR  2. Essential hypertension -PT's HCTZ stopped today -RTO in 2 weeks to recheck  -Low salt diet and encourage exercise - BMP8+EGFR   Continue all meds Labs pending Health Maintenance reviewed-PT's mammogram scheduled for next month Diet and exercise encouraged RTO 2 weeks  Evelina Dun, FNP

## 2015-02-23 NOTE — Patient Instructions (Signed)

## 2015-02-24 LAB — BMP8+EGFR
BUN/Creatinine Ratio: 14 (ref 9–23)
BUN: 10 mg/dL (ref 6–24)
CO2: 26 mmol/L (ref 18–29)
Calcium: 9.1 mg/dL (ref 8.7–10.2)
Chloride: 104 mmol/L (ref 96–106)
Creatinine, Ser: 0.7 mg/dL (ref 0.57–1.00)
GFR calc Af Amer: 114 mL/min/{1.73_m2} (ref >59)
GFR calc non Af Amer: 99 mL/min/{1.73_m2} (ref >59)
Glucose: 90 mg/dL (ref 65–99)
Potassium: 3.7 mmol/L (ref 3.5–5.2)
Sodium: 145 mmol/L — ABNORMAL HIGH (ref 134–144)

## 2015-02-26 ENCOUNTER — Telehealth: Payer: Self-pay | Admitting: Family

## 2015-02-26 NOTE — Telephone Encounter (Signed)
Patient came back down to twice a day, but continue at twice a day for now.

## 2015-02-26 NOTE — Telephone Encounter (Signed)
Patient aware.

## 2015-02-27 ENCOUNTER — Encounter: Payer: Self-pay | Admitting: *Deleted

## 2015-03-05 ENCOUNTER — Ambulatory Visit: Payer: BLUE CROSS/BLUE SHIELD | Admitting: Family

## 2015-03-09 ENCOUNTER — Ambulatory Visit (INDEPENDENT_AMBULATORY_CARE_PROVIDER_SITE_OTHER): Payer: BLUE CROSS/BLUE SHIELD | Admitting: Family

## 2015-03-09 ENCOUNTER — Encounter: Payer: Self-pay | Admitting: Family

## 2015-03-09 VITALS — BP 133/90 | HR 66 | Temp 97.0°F | Ht 60.0 in | Wt 175.0 lb

## 2015-03-09 DIAGNOSIS — J309 Allergic rhinitis, unspecified: Secondary | ICD-10-CM | POA: Diagnosis not present

## 2015-03-09 DIAGNOSIS — E039 Hypothyroidism, unspecified: Secondary | ICD-10-CM

## 2015-03-09 DIAGNOSIS — I10 Essential (primary) hypertension: Secondary | ICD-10-CM

## 2015-03-09 DIAGNOSIS — E785 Hyperlipidemia, unspecified: Secondary | ICD-10-CM | POA: Diagnosis not present

## 2015-03-09 DIAGNOSIS — E559 Vitamin D deficiency, unspecified: Secondary | ICD-10-CM | POA: Diagnosis not present

## 2015-03-09 MED ORDER — MOMETASONE FUROATE 50 MCG/ACT NA SUSP: 2.0000 | Freq: Every day | NASAL | Status: DC

## 2015-03-09 NOTE — Patient Instructions (Signed)
Hypokalemia Hypokalemia means that the amount of potassium in the blood is lower than normal.Potassium is a chemical, called an electrolyte, that helps regulate the amount of fluid in the body. It also stimulates muscle contraction and helps nerves function properly.Most of the body's potassium is inside of cells, and only a very small amount is in the blood. Because the amount in the blood is so small, minor changes can be life-threatening. CAUSES  Antibiotics.  Diarrhea or vomiting.  Using laxatives too much, which can cause diarrhea.  Chronic kidney disease.  Water pills (diuretics).  Eating disorders (bulimia).  Low magnesium level.  Sweating a lot. SIGNS AND SYMPTOMS  Weakness.  Constipation.  Fatigue.  Muscle cramps.  Mental confusion.  Skipped heartbeats or irregular heartbeat (palpitations).  Tingling or numbness. DIAGNOSIS  Your health care provider can diagnose hypokalemia with blood tests. In addition to checking your potassium level, your health care provider may also check other lab tests. TREATMENT Hypokalemia can be treated with potassium supplements taken by mouth or adjustments in your current medicines. If your potassium level is very low, you may need to get potassium through a vein (IV) and be monitored in the hospital. A diet high in potassium is also helpful. Foods high in potassium are:  Nuts, such as peanuts and pistachios.  Seeds, such as sunflower seeds and pumpkin seeds.  Peas, lentils, and lima beans.  Whole grain and bran cereals and breads.  Fresh fruit and vegetables, such as apricots, avocado, bananas, cantaloupe, kiwi, oranges, tomatoes, asparagus, and potatoes.  Orange and tomato juices.  Red meats.  Fruit yogurt. HOME CARE INSTRUCTIONS  Take all medicines as prescribed by your health care provider.  Maintain a healthy diet by including nutritious food, such as fruits, vegetables, nuts, whole grains, and lean meats.  If  you are taking a laxative, be sure to follow the directions on the label. SEEK MEDICAL CARE IF:  Your weakness gets worse.  You feel your heart pounding or racing.  You are vomiting or having diarrhea.  You are diabetic and having trouble keeping your blood glucose in the normal range. SEEK IMMEDIATE MEDICAL CARE IF:  You have chest pain, shortness of breath, or dizziness.  You are vomiting or having diarrhea for more than 2 days.  You faint. MAKE SURE YOU:   Understand these instructions.  Will watch your condition.  Will get help right away if you are not doing well or get worse.   This information is not intended to replace advice given to you by your health care provider. Make sure you discuss any questions you have with your health care provider.   Document Released: 01/27/2005 Document Revised: 02/17/2014 Document Reviewed: 07/30/2012 Elsevier Interactive Patient Education 2016 [place] Maintenance, Female Adopting a healthy lifestyle and getting preventive care can go a long way to promote health and wellness. Talk with your health care provider about what schedule of regular examinations is right for you. This is a good chance for you to check in with your provider about disease prevention and staying healthy. In between checkups, there are plenty of things you can do on your own. Experts have done a lot of research about which lifestyle changes and preventive measures are most likely to keep you healthy. Ask your health care provider for more information. WEIGHT AND DIET  Eat a healthy diet  Be sure to include plenty of vegetables, fruits, low-fat dairy products, and lean protein.  Do not eat a lot of  foods high in solid fats, added sugars, or salt.  Get regular exercise. This is one of the most important things you can do for your health.  Most adults should exercise for at least 150 minutes each week. The exercise should increase your heart rate and  make you sweat (moderate-intensity exercise).  Most adults should also do strengthening exercises at least twice a week. This is in addition to the moderate-intensity exercise.  Maintain a healthy weight  Body mass index (BMI) is a measurement that can be used to identify possible weight problems. It estimates body fat based on height and weight. Your health care provider can help determine your BMI and help you achieve or maintain a healthy weight.  For females 69 years of age and older:   A BMI below 18.5 is considered underweight.  A BMI of 18.5 to 24.9 is normal.  A BMI of 25 to 29.9 is considered overweight.  A BMI of 30 and above is considered obese.  Watch levels of cholesterol and blood lipids  You should start having your blood tested for lipids and cholesterol at 55 years of age, then have this test every 5 years.  You may need to have your cholesterol levels checked more often if:  Your lipid or cholesterol levels are high.  You are older than 55 years of age.  You are at high risk for heart disease.  CANCER SCREENING   Lung Cancer  Lung cancer screening is recommended for adults 60-16 years old who are at high risk for lung cancer because of a history of smoking.  A yearly low-dose CT scan of the lungs is recommended for people who:  Currently smoke.  Have quit within the past 15 years.  Have at least a 30-pack-year history of smoking. A pack year is smoking an average of one pack of cigarettes a day for 1 year.  Yearly screening should continue until it has been 15 years since you quit.  Yearly screening should stop if you develop a health problem that would prevent you from having lung cancer treatment.  Breast Cancer  Practice breast self-awareness. This means understanding how your breasts normally appear and feel.  It also means doing regular breast self-exams. Let your health care provider know about any changes, no matter how small.  If you  are in your 20s or 30s, you should have a clinical breast exam (CBE) by a health care provider every 1-3 years as part of a regular health exam.  If you are 64 or older, have a CBE every year. Also consider having a breast X-ray (mammogram) every year.  If you have a family history of breast cancer, talk to your health care provider about genetic screening.  If you are at high risk for breast cancer, talk to your health care provider about having an MRI and a mammogram every year.  Breast cancer gene (BRCA) assessment is recommended for women who have family members with BRCA-related cancers. BRCA-related cancers include:  Breast.  Ovarian.  Tubal.  Peritoneal cancers.  Results of the assessment will determine the need for genetic counseling and BRCA1 and BRCA2 testing. Cervical Cancer Your health care provider may recommend that you be screened regularly for cancer of the pelvic organs (ovaries, uterus, and vagina). This screening involves a pelvic examination, including checking for microscopic changes to the surface of your cervix (Pap test). You may be encouraged to have this screening done every 3 years, beginning at age 60.  For  women ages 71-65, health care providers may recommend pelvic exams and Pap testing every 3 years, or they may recommend the Pap and pelvic exam, combined with testing for human papilloma virus (HPV), every 5 years. Some types of HPV increase your risk of cervical cancer. Testing for HPV may also be done on women of any age with unclear Pap test results.  Other health care providers may not recommend any screening for nonpregnant women who are considered low risk for pelvic cancer and who do not have symptoms. Ask your health care provider if a screening pelvic exam is right for you.  If you have had past treatment for cervical cancer or a condition that could lead to cancer, you need Pap tests and screening for cancer for at least 20 years after your  treatment. If Pap tests have been discontinued, your risk factors (such as having a new sexual partner) need to be reassessed to determine if screening should resume. Some women have medical problems that increase the chance of getting cervical cancer. In these cases, your health care provider may recommend more frequent screening and Pap tests. Colorectal Cancer  This type of cancer can be detected and often prevented.  Routine colorectal cancer screening usually begins at 55 years of age and continues through 55 years of age.  Your health care provider may recommend screening at an earlier age if you have risk factors for colon cancer.  Your health care provider may also recommend using home test kits to check for hidden blood in the stool.  A small camera at the end of a tube can be used to examine your colon directly (sigmoidoscopy or colonoscopy). This is done to check for the earliest forms of colorectal cancer.  Routine screening usually begins at age 41.  Direct examination of the colon should be repeated every 5-10 years through 55 years of age. However, you may need to be screened more often if early forms of precancerous polyps or small growths are found. Skin Cancer  Check your skin from head to toe regularly.  Tell your health care provider about any new moles or changes in moles, especially if there is a change in a mole's shape or color.  Also tell your health care provider if you have a mole that is larger than the size of a pencil eraser.  Always use sunscreen. Apply sunscreen liberally and repeatedly throughout the day.  Protect yourself by wearing long sleeves, pants, a wide-brimmed hat, and sunglasses whenever you are outside. HEART DISEASE, DIABETES, AND HIGH BLOOD PRESSURE   High blood pressure causes heart disease and increases the risk of stroke. High blood pressure is more likely to develop in:  People who have blood pressure in the high end of the normal range  (130-139/85-89 mm Hg).  People who are overweight or obese.  People who are African American.  If you are 39-29 years of age, have your blood pressure checked every 3-5 years. If you are 69 years of age or older, have your blood pressure checked every year. You should have your blood pressure measured twice--once when you are at a hospital or clinic, and once when you are not at a hospital or clinic. Record the average of the two measurements. To check your blood pressure when you are not at a hospital or clinic, you can use:  An automated blood pressure machine at a pharmacy.  A home blood pressure monitor.  If you are between 55 years and 79 years  old, ask your health care provider if you should take aspirin to prevent strokes.  Have regular diabetes screenings. This involves taking a blood sample to check your fasting blood sugar level.  If you are at a normal weight and have a low risk for diabetes, have this test once every three years after 55 years of age.  If you are overweight and have a high risk for diabetes, consider being tested at a younger age or more often. PREVENTING INFECTION  Hepatitis B  If you have a higher risk for hepatitis B, you should be screened for this virus. You are considered at high risk for hepatitis B if:  You were born in a country where hepatitis B is common. Ask your health care provider which countries are considered high risk.  Your parents were born in a high-risk country, and you have not been immunized against hepatitis B (hepatitis B vaccine).  You have HIV or AIDS.  You use needles to inject street drugs.  You live with someone who has hepatitis B.  You have had sex with someone who has hepatitis B.  You get hemodialysis treatment.  You take certain medicines for conditions, including cancer, organ transplantation, and autoimmune conditions. Hepatitis C  Blood testing is recommended for:  Everyone born from [birth year] through  1965.  Anyone with known risk factors for hepatitis C. Sexually transmitted infections (STIs)  You should be screened for sexually transmitted infections (STIs) including gonorrhea and chlamydia if:  You are sexually active and are younger than 55 years of age.  You are older than 55 years of age and your health care provider tells you that you are at risk for this type of infection.  Your sexual activity has changed since you were last screened and you are at an increased risk for chlamydia or gonorrhea. Ask your health care provider if you are at risk.  If you do not have HIV, but are at risk, it may be recommended that you take a prescription medicine daily to prevent HIV infection. This is called pre-exposure prophylaxis (PrEP). You are considered at risk if:  You are sexually active and do not regularly use condoms or know the HIV status of your partner(s).  You take drugs by injection.  You are sexually active with a partner who has HIV. Talk with your health care provider about whether you are at high risk of being infected with HIV. If you choose to begin PrEP, you should first be tested for HIV. You should then be tested every 3 months for as long as you are taking PrEP.  PREGNANCY   If you are premenopausal and you may become pregnant, ask your health care provider about preconception counseling.  If you may become pregnant, take 400 to 800 micrograms (mcg) of folic acid every day.  If you want to prevent pregnancy, talk to your health care provider about birth control (contraception). OSTEOPOROSIS AND MENOPAUSE   Osteoporosis is a disease in which the bones lose minerals and strength with aging. This can result in serious bone fractures. Your risk for osteoporosis can be identified using a bone density scan.  If you are 56 years of age or older, or if you are at risk for osteoporosis and fractures, ask your health care provider if you should be screened.  Ask your health  care provider whether you should take a calcium or vitamin D supplement to lower your risk for osteoporosis.  Menopause may have certain physical symptoms and  risks.  Hormone replacement therapy may reduce some of these symptoms and risks. Talk to your health care provider about whether hormone replacement therapy is right for you.  HOME CARE INSTRUCTIONS   Schedule regular health, dental, and eye exams.  Stay current with your immunizations.   Do not use any tobacco products including cigarettes, chewing tobacco, or electronic cigarettes.  If you are pregnant, do not drink alcohol.  If you are breastfeeding, limit how much and how often you drink alcohol.  Limit alcohol intake to no more than 1 drink per day for nonpregnant women. One drink equals 12 ounces of beer, 5 ounces of wine, or 1 ounces of hard liquor.  Do not use street drugs.  Do not share needles.  Ask your health care provider for help if you need support or information about quitting drugs.  Tell your health care provider if you often feel depressed.  Tell your health care provider if you have ever been abused or do not feel safe at home.   This information is not intended to replace advice given to you by your health care provider. Make sure you discuss any questions you have with your health care provider.   Document Released: 08/12/2010 Document Revised: 02/17/2014 Document Reviewed: 12/29/2012 Elsevier Interactive Patient Education [employer].

## 2015-03-09 NOTE — Addendum Note (Signed)
Addended by: Earlene Plater on: 03/09/2015 11:43 AM   Modules accepted: Miquel Dunn

## 2015-03-09 NOTE — Progress Notes (Signed)
Subjective:    Patient ID: Pamela [name], female    DOB: 1961-01-06, 55 y.o.   MRN: [id number]  Pt presents to the office today for chronic follow up.   Hypertension This is a chronic problem. The current episode started more than 1 year ago. The problem has been resolved since onset. The problem is controlled. Pertinent negatives include no anxiety, headaches, palpitations, peripheral edema or shortness of breath. Risk factors for coronary artery disease include dyslipidemia, obesity, post-menopausal state, sedentary lifestyle and family history. Past treatments include nothing. The current treatment provides mild improvement. There is no history of kidney disease, CAD/MI, CVA, heart failure or a thyroid problem. There is no history of sleep apnea.  Hyperlipidemia This is a chronic problem. The current episode started more than 1 year ago. The problem is uncontrolled. Recent lipid tests were reviewed and are high. Exacerbating diseases include obesity. Pertinent negatives include no leg pain or shortness of breath. Current antihyperlipidemic treatment includes statins. The current treatment provides moderate improvement of lipids. Risk factors for coronary artery disease include dyslipidemia, hypertension, obesity, post-menopausal and family history.  Thyroid Problem Presents for follow-up visit. Patient reports no anxiety, depressed mood, dry skin, hoarse voice or palpitations. The symptoms have been stable. Past treatments include levothyroxine. The treatment provided significant relief. Her past medical history is significant for hyperlipidemia. There is no history of heart failure.      Review of Systems  Constitutional: Negative.   HENT: Negative.  Negative for hoarse voice.   Eyes: Negative.   Respiratory: Negative.  Negative for shortness of breath.   Cardiovascular: Negative.  Negative for palpitations.  Gastrointestinal: Negative.   Endocrine: Negative.   Genitourinary: Negative.     Musculoskeletal: Negative.   Neurological: Negative.  Negative for headaches.  Hematological: Negative.   Psychiatric/Behavioral: Negative.  The patient is not nervous/anxious.   All other systems reviewed and are negative.      Objective:   Physical Exam  Constitutional: She is oriented to person, place, and time. She appears well-developed and well-nourished. No distress.  HENT:  Head: Normocephalic and atraumatic.  Right Ear: External ear normal.  Left Ear: External ear normal.  Mouth/Throat: Oropharynx is clear and moist.  Nasal passage erythemas with mild swelling    Eyes: Pupils are equal, round, and reactive to [name].  Neck: Normal range of motion. Neck supple. No thyromegaly present.  Cardiovascular: Normal rate, regular rhythm, normal heart sounds and intact distal pulses.   No murmur heard. Pulmonary/Chest: Effort normal and breath sounds normal. No respiratory distress. She has no wheezes.  Abdominal: Soft. Bowel sounds are normal. She exhibits no distension. There is no tenderness.  Musculoskeletal: Normal range of motion. She exhibits no edema or tenderness.  Neurological: She is alert and oriented to person, place, and time. She has normal reflexes. No cranial nerve deficit.  Skin: Skin is warm and dry.  Psychiatric: She has a normal mood and affect. Her behavior is normal. Judgment and thought content normal.  Vitals reviewed.     BP 133/90 mmHg  Pulse 66  Temp(Src) 97 F (36.1 C) (Oral)  Ht 5' (1.524 m)  Wt 175 lb (79.379 kg)  BMI 34.18 kg/m2     Assessment & Plan:  1. Hyperlipidemia - CMP14+EGFR - Lipid panel  2. Vitamin D deficiency - CMP14+EGFR - VITAMIN D 25 Hydroxy (Vit-D Deficiency, Fractures)  3. Hypothyroidism, unspecified hypothyroidism type - CMP14+EGFR - Thyroid Panel With TSH  4. Essential hypertension - CMP14+EGFR  5.  Allergic rhinitis, unspecified allergic rhinitis type - mometasone (NASONEX) 50 MCG/ACT nasal spray; Place 2  sprays into the nose daily.  Dispense: 17 g; Refill: 12   Continue all meds Labs pending Health Maintenance reviewed- Pt has mammogram scheduled next month Diet and exercise encouraged RTO 58month  CEvelina Dun FNP

## 2015-03-10 LAB — LIPID PANEL
Chol/HDL Ratio: 2.5 ratio units (ref 0.0–4.4)
Cholesterol, Total: 157 mg/dL (ref 100–199)
HDL: 62 mg/dL (ref >39)
LDL Calculated: 79 mg/dL (ref 0–99)
Triglycerides: 79 mg/dL (ref 0–149)
VLDL Cholesterol Cal: 16 mg/dL (ref 5–40)

## 2015-03-10 LAB — CMP14+EGFR
ALT: 29 IU/L (ref 0–32)
AST: 21 IU/L (ref 0–40)
Albumin/Globulin Ratio: 1.6 (ref 1.1–2.5)
Albumin: 4 g/dL (ref 3.5–5.5)
Alkaline Phosphatase: 78 IU/L (ref 39–117)
BUN/Creatinine Ratio: 19 (ref 9–23)
BUN: 15 mg/dL (ref 6–24)
Bilirubin Total: 0.3 mg/dL (ref 0.0–1.2)
CO2: 25 mmol/L (ref 18–29)
Calcium: 9.2 mg/dL (ref 8.7–10.2)
Chloride: 102 mmol/L (ref 96–106)
Creatinine, Ser: 0.77 mg/dL (ref 0.57–1.00)
GFR calc Af Amer: 101 mL/min/{1.73_m2} (ref >59)
GFR calc non Af Amer: 88 mL/min/{1.73_m2} (ref >59)
Globulin, Total: 2.5 g/dL (ref 1.5–4.5)
Glucose: 80 mg/dL (ref 65–99)
Potassium: 3.9 mmol/L (ref 3.5–5.2)
Sodium: 142 mmol/L (ref 134–144)
Total Protein: 6.5 g/dL (ref 6.0–8.5)

## 2015-03-10 LAB — THYROID PANEL WITH TSH
Free Thyroxine Index: 2.1 (ref 1.2–4.9)
T3 Uptake Ratio: 26 % (ref 24–39)
T4, Total: 8.1 ug/dL (ref 4.5–12.0)
TSH: 2.09 u[IU]/mL (ref 0.450–4.500)

## 2015-03-10 LAB — VITAMIN D 25 HYDROXY (VIT D DEFICIENCY, FRACTURES): Vit D, 25-Hydroxy: 39.8 ng/mL (ref 30.0–100.0)

## 2015-05-12 ENCOUNTER — Ambulatory Visit (INDEPENDENT_AMBULATORY_CARE_PROVIDER_SITE_OTHER): Payer: BLUE CROSS/BLUE SHIELD | Admitting: Family

## 2015-05-12 VITALS — BP 149/99 | HR 81 | Temp 97.1°F | Ht 60.0 in | Wt 178.0 lb

## 2015-05-12 DIAGNOSIS — L237 Allergic contact dermatitis due to plants, except food: Secondary | ICD-10-CM | POA: Diagnosis not present

## 2015-05-12 MED ORDER — TRIAMCINOLONE ACETONIDE 0.5 % EX OINT: 1.0000 "application " | TOPICAL_OINTMENT | Freq: Two times a day (BID) | CUTANEOUS | Status: DC

## 2015-05-12 MED ORDER — METHYLPREDNISOLONE ACETATE 80 MG/ML IJ SUSP
80.0000 mg | Freq: Once | INTRAMUSCULAR | Status: AC
  Administered 2015-05-12: 80 mg via INTRAMUSCULAR

## 2015-05-12 NOTE — Progress Notes (Signed)
   Subjective:    Patient ID: Pamela [name], female    DOB: [birth year], 55 y.o.   MRN: [id number]  Rash This is a new problem. The current episode started in the past 7 days. The problem has been rapidly worsening since onset. The affected locations include the face, neck, head, left ankle, left hand, left eye, left arm, right arm and right ankle. The rash is characterized by pain and redness. She was exposed to plant contact. Pertinent negatives include no congestion, cough, fever, nail changes or shortness of breath. Past treatments include cold compress, anti-itch cream and antihistamine. The treatment provided no relief.      Review of Systems  Constitutional: Negative.  Negative for fever.  HENT: Negative.  Negative for congestion.   Eyes: Negative.   Respiratory: Negative.  Negative for cough and shortness of breath.   Cardiovascular: Negative.  Negative for palpitations.  Gastrointestinal: Negative.   Endocrine: Negative.   Genitourinary: Negative.   Musculoskeletal: Negative.   Skin: Positive for rash. Negative for nail changes.  Neurological: Negative.  Negative for headaches.  Hematological: Negative.   Psychiatric/Behavioral: Negative.   All other systems reviewed and are negative.      Objective:   Physical Exam  Constitutional: She is oriented to person, place, and time. She appears well-developed and well-nourished. No distress.  HENT:  Head: Normocephalic and atraumatic.  Right Ear: External ear normal.  Cardiovascular: Normal rate, regular rhythm, normal heart sounds and intact distal pulses.   No murmur heard. Pulmonary/Chest: Effort normal and breath sounds normal. No respiratory distress. She has no wheezes.  Musculoskeletal: Normal range of motion. She exhibits no edema or tenderness.  Neurological: She is alert and oriented to person, place, and time.  Skin: Skin is warm and dry. Rash noted. There is erythema.  Generalized erythemas rash on neck, left eye,  right arm, bilateral legs  Psychiatric: She has a normal mood and affect. Her behavior is normal. Judgment and thought content normal.  Vitals reviewed.    BP 149/99 mmHg  Pulse 81  Temp(Src) 97.1 F (36.2 C) (Oral)  Ht 5' (1.524 m)  Wt 178 lb (80.74 kg)  BMI 34.76 kg/m2      Assessment & Plan:  1. Contact dermatitis due to poison oak --Do not scratch -Wear protective clothing while outside- Long sleeves and long pants -Take a shower as soon as possible after being outside - methylPREDNISolone acetate (DEPO-MEDROL) injection 80 mg; Inject 1 mL (80 mg total) into the muscle once. - triamcinolone ointment (KENALOG) 0.5 %; Apply 1 application topically 2 (two) times daily.  Dispense: 60 g; Refill: Newman, FNP

## 2015-05-12 NOTE — Patient Instructions (Signed)
Po[facility] is an inflammation of the skin (contact dermatitis). It is caused by contact with the allergens on the leaves of the oak (toxicodendron) plants. Depending on your sensitivity, the rash may consist simply of redness and itching, or it may also progress to blisters which may break open (rupture). These must be well cared for to prevent secondary germ (bacterial) infection as these infections can lead to scarring. The eyes may also get puffy. The puffiness is worst in the morning and gets better as the day progresses. Healing is best accomplished by keeping any open areas dry, clean, covered with a bandage, and covered with an antibacterial ointment if needed. Without secondary infection, this dermatitis usually heals without scarring within 2 to 3 weeks without treatment. HOME CARE INSTRUCTIONS When you have been exposed to poison oak, it is very important to thoroughly wash with soap and water as soon as the exposure has been discovered. You have about one half hour to remove the plant resin before it will cause the rash. This cleaning will quickly destroy the oil or antigen on the skin (the antigen is what causes the rash). Wash aggressively under the fingernails as any plant resin still there will continue to spread the rash. Do not rub skin vigorously when washing affected area. Poison oak cannot spread if no oil from the plant remains on your body. Rash that has progressed to weeping sores (lesions) will not spread the rash unless you have not washed thoroughly. It is also important to clean any clothes you have been wearing as they may carry active allergens which will spread the rash, even several days later. Avoidance of the plant in the future is the best measure. Poison oak plants can be recognized by the number of leaves. Generally, poison oak has three leaves with flowering branches on a single stem. Diphenhydramine may be purchased over the counter and used as needed for  itching. Do not drive with this medication if it makes you drowsy. Ask your caregiver about medication for children. SEEK IMMEDIATE MEDICAL CARE IF:   Open areas of the rash develop.  You notice redness extending beyond the area of the rash.  There is a pus like discharge.  There is increased pain.  Other signs of infection develop (such as fever).   This information is not intended to replace advice given to you by your health care provider. Make sure you discuss any questions you have with your health care provider.   Document Released: 08/03/2002 Document Revised: 04/21/2011 Document Reviewed: 07/05/2014 Elsevier Interactive Patient Education [employer].

## 2015-06-04 ENCOUNTER — Encounter: Payer: BLUE CROSS/BLUE SHIELD | Admitting: *Deleted

## 2015-06-04 LAB — HM MAMMOGRAPHY

## 2015-06-13 ENCOUNTER — Encounter: Payer: Self-pay | Admitting: *Deleted

## 2015-06-20 ENCOUNTER — Encounter: Payer: Self-pay | Admitting: Gastroenterology

## 2015-07-02 ENCOUNTER — Other Ambulatory Visit: Payer: Self-pay | Admitting: Family

## 2015-07-13 ENCOUNTER — Ambulatory Visit: Payer: BLUE CROSS/BLUE SHIELD | Admitting: Family Medicine

## 2015-08-17 ENCOUNTER — Encounter (INDEPENDENT_AMBULATORY_CARE_PROVIDER_SITE_OTHER): Payer: Self-pay

## 2015-08-17 ENCOUNTER — Encounter: Payer: Self-pay | Admitting: Gastroenterology

## 2015-08-17 ENCOUNTER — Other Ambulatory Visit: Payer: Self-pay

## 2015-08-17 ENCOUNTER — Ambulatory Visit (INDEPENDENT_AMBULATORY_CARE_PROVIDER_SITE_OTHER): Payer: BLUE CROSS/BLUE SHIELD | Admitting: Gastroenterology

## 2015-08-17 VITALS — BP 115/73 | HR 72 | Temp 97.7°F | Ht 65.0 in | Wt 173.6 lb

## 2015-08-17 DIAGNOSIS — K219 Gastro-esophageal reflux disease without esophagitis: Secondary | ICD-10-CM

## 2015-08-17 DIAGNOSIS — Z860100 Personal history of colon polyps, unspecified: Secondary | ICD-10-CM

## 2015-08-17 DIAGNOSIS — R1310 Dysphagia, unspecified: Secondary | ICD-10-CM

## 2015-08-17 MED ORDER — NA SULFATE-K SULFATE-MG SULF 17.5-3.13-1.6 GM/177ML PO SOLN: 1.0000 | ORAL | Status: DC

## 2015-08-17 MED ORDER — PANTOPRAZOLE SODIUM 40 MG PO TBEC: 40.0000 mg | DELAYED_RELEASE_TABLET | Freq: Every day | ORAL | Status: DC

## 2015-08-17 NOTE — Patient Instructions (Addendum)
Thanks for your patience today!  We have scheduled you for a colonoscopy, upper endoscopy, and possible dilation with Dr. Oneida Alar in the near future!  Start taking Protonix once each day, 30 minutes before breakfast.   Please see the attached handout on reflux.   Food Choices for Gastroesophageal Reflux Disease, Adult When you have gastroesophageal reflux disease (GERD), the foods you eat and your eating habits are very important. Choosing the right foods can help ease the discomfort of GERD. WHAT GENERAL GUIDELINES DO I NEED TO FOLLOW?  Choose fruits, vegetables, whole grains, low-fat dairy products, and low-fat meat, fish, and poultry.  Limit fats such as oils, salad dressings, butter, nuts, and avocado.  Keep a food diary to identify foods that cause symptoms.  Avoid foods that cause reflux. These may be different for different people.  Eat frequent small meals instead of three large meals each day.  Eat your meals slowly, in a relaxed setting.  Limit fried foods.  Cook foods using methods other than frying.  Avoid drinking alcohol.  Avoid drinking large amounts of liquids with your meals.  Avoid bending over or lying down until 2-3 hours after eating. WHAT FOODS ARE NOT RECOMMENDED? The following are some foods and drinks that may worsen your symptoms: Vegetables Tomatoes. Tomato juice. Tomato and spaghetti sauce. Chili peppers. Onion and garlic. Horseradish. Fruits Oranges, grapefruit, and lemon (fruit and juice). Meats High-fat meats, fish, and poultry. This includes hot dogs, ribs, ham, sausage, salami, and bacon. Dairy Whole milk and chocolate milk. Sour cream. Cream. Butter. Ice cream. Cream cheese.  Beverages Coffee and tea, with or without caffeine. Carbonated beverages or energy drinks. Condiments Hot sauce. Barbecue sauce.  Sweets/Desserts Chocolate and cocoa. Donuts. Peppermint and spearmint. Fats and Oils High-fat foods, including [place] fries and  potato chips. Other Vinegar. Strong spices, such as black pepper, white pepper, red pepper, cayenne, curry powder, cloves, ginger, and chili powder. The items listed above may not be a complete list of foods and beverages to avoid. Contact your dietitian for more information.   This information is not intended to replace advice given to you by your health care provider. Make sure you discuss any questions you have with your health care provider.   Document Released: 01/27/2005 Document Revised: 02/17/2014 Document Reviewed: 12/01/2012 Elsevier Interactive Patient Education [employer].

## 2015-08-17 NOTE — Progress Notes (Signed)
Primary Care Physician:  Evelina Dun, FNP Primary Gastroenterologist:  Dr. Oneida Alar   Chief Complaint  Patient presents with  . Colonoscopy  . Abdominal Pain    left side when she bends  . Nausea    most of the time    HPI:   Pamela [name] is a 55 y.o. female presenting today to schedule a 3 year surveillance colonoscopy  due to history of simple adenomas.   Notes a lot of heart burn. When eating, it hurts going down. Pain in left side when bending over "only a certain way". A couple of times when eating, it comes back up. Food gets hung up when eating. Food stays in upper chest and then settles in top of stomach. No prior upper endoscopy. A lot of nausea, no vomiting. Taking tums OTC. Ibuprofen as needed. Left-sided abdominal pain when bending over. No fever/chills. Present for a year. Not constant. Just "hits you", feels like you are going to throw up.   Past Medical History  Diagnosis Date  . GERD (gastroesophageal reflux disease)     heartburn  . Headache(784.0)     migraines  . Ovarian cyst   . Thyroid disease   . Hyperlipidemia     Past Surgical History  Procedure Laterality Date  . Tubal ligation    . Colonoscopy N/A 07/30/2012    Dr. Oneida Alar: 3 simple adenomas, surveillance 2017  . Cesarean section      Current Outpatient Prescriptions  Medication Sig Dispense Refill  . aspirin 81 MG tablet Take 81 mg by mouth daily.    . Calcium Carbonate Antacid (TUMS E-X PO) Take by mouth. Takes approximately 4 -8 daily    . Cinnamon 500 MG TABS Take 1,000 mg by mouth daily.    . Garlic 9518 MG CAPS Take by mouth daily.    . hydrochlorothiazide (HYDRODIURIL) 25 MG tablet Reported on 05/12/2015  3  . ibuprofen (ADVIL,MOTRIN) 200 MG tablet Take 200 mg by mouth every 6 (six) hours as needed for pain. Reported on 05/12/2015    . levothyroxine (SYNTHROID, LEVOTHROID) 25 MCG tablet TAKE 1 TABLET (25 MCG TOTAL) BY MOUTH DAILY BEFORE BREAKFAST. 90 tablet 1  . Vitamin D, Ergocalciferol,  (DRISDOL) 50000 UNITS CAPS capsule Take 1 capsule (50,000 Units total) by mouth every 7 (seven) days. 12 capsule 3  . Na Sulfate-K Sulfate-Mg Sulf (SUPREP BOWEL PREP KIT) 17.5-3.13-1.6 GM/180ML SOLN Take 1 kit by mouth as directed. 1 Bottle 0  . pantoprazole (PROTONIX) 40 MG tablet Take 1 tablet (40 mg total) by mouth daily. 30 minutes before breakfast 30 tablet 3   No current facility-administered medications for this visit.    Allergies as of 08/17/2015 - Review Complete 08/17/2015  Allergen Reaction Noted  . Iodine Hives 07/21/2012    Family History  Problem Relation Age of Onset  . Diabetes Mother   . Heart disease Mother   . Colon cancer Neg Hx   . Colon polyps Neg Hx     Social History   Social History  . Marital Status: Married    Spouse Name: N/A  . Number of Children: N/A  . Years of Education: N/A   Occupational History  . Not on file.   Social History Main Topics  . Smoking status: Never Smoker   . Smokeless tobacco: Not on file     Comment: Never smoked  . Alcohol Use: No  . Drug Use: No  . Sexual Activity: Yes   Other Topics  Concern  . Not on file   Social History Narrative    Review of Systems: Negative unless mentioned in HPI.   Physical Exam: BP 115/73 mmHg  Pulse 72  Temp(Src) 97.7 F (36.5 C) (Oral)  Ht '5\' 5"'$  (1.651 m)  Wt 173 lb 9.6 oz (78.744 kg)  BMI 28.89 kg/m2 General:   Alert and oriented. Pleasant and cooperative. Well-nourished and well-developed.  Head:  Normocephalic and atraumatic. Eyes:  Without icterus, sclera clear and conjunctiva pink.  Ears:  Normal auditory acuity. Nose:  No deformity, discharge,  or lesions. Mouth:  No deformity or lesions, oral mucosa pink.  Lungs:  Clear to auscultation bilaterally. No wheezes, rales, or rhonchi. No distress.  Heart:  S1, S2 present without murmurs appreciated.  Abdomen:  +BS, soft, very mild pinpoint TTP left lower quadrant  and non-distended. No HSM noted. No guarding or rebound.  No masses appreciated.  Rectal:  Deferred  Msk:  Symmetrical without gross deformities. Normal posture. Extremities:  Without  edema. Neurologic:  Alert and  oriented x4;  grossly normal neurologically. Psych:  Alert and cooperative. Normal mood and affect.

## 2015-08-19 ENCOUNTER — Encounter: Payer: Self-pay | Admitting: Gastroenterology

## 2015-08-19 NOTE — Assessment & Plan Note (Signed)
Start Protonix once daily.   As of note, reports left lower sided discomfort only with bending over occasionally. No significant findings on exam and query musculoskeletal. Discussed completing colonoscopy and considering CT scan if pain persists or becomes more bothersome.

## 2015-08-19 NOTE — Assessment & Plan Note (Signed)
55 year old female due for 3-year- surveillance, without any concerning lower GI symptoms.  Proceed with colonoscopy with Dr. Oneida Alar in the near future. The risks, benefits, and alternatives have been discussed in detail with the patient. They state understanding and desire to proceed.

## 2015-08-19 NOTE — Assessment & Plan Note (Signed)
Solid food dysphagia in setting of uncontrolled and untreated GERD. Likely uncontrolled GERD as culprit, esophagitis, possible web, ring, stricture, doubt malignancy. Discussed avoiding Ibuprofen.   Start Protonix once daily.  Proceed with upper endoscopy +/- dilation  in the near future with Dr. Oneida Alar. The risks, benefits, and alternatives have been discussed in detail with patient. They have stated understanding and desire to proceed.

## 2015-08-20 NOTE — Progress Notes (Signed)
CC'ED TO PCP 

## 2015-09-03 ENCOUNTER — Ambulatory Visit (INDEPENDENT_AMBULATORY_CARE_PROVIDER_SITE_OTHER): Payer: BLUE CROSS/BLUE SHIELD | Admitting: Nurse Practitioner

## 2015-09-03 ENCOUNTER — Encounter: Payer: Self-pay | Admitting: Nurse Practitioner

## 2015-09-03 ENCOUNTER — Ambulatory Visit (INDEPENDENT_AMBULATORY_CARE_PROVIDER_SITE_OTHER): Payer: BLUE CROSS/BLUE SHIELD

## 2015-09-03 VITALS — BP 133/78 | HR 71 | Temp 97.4°F | Ht 60.0 in | Wt 177.0 lb

## 2015-09-03 DIAGNOSIS — Z6834 Body mass index (BMI) 34.0-34.9, adult: Secondary | ICD-10-CM | POA: Diagnosis not present

## 2015-09-03 DIAGNOSIS — I10 Essential (primary) hypertension: Secondary | ICD-10-CM | POA: Diagnosis not present

## 2015-09-03 DIAGNOSIS — Z0000 Encounter for general adult medical examination without abnormal findings: Secondary | ICD-10-CM | POA: Diagnosis not present

## 2015-09-03 DIAGNOSIS — E039 Hypothyroidism, unspecified: Secondary | ICD-10-CM | POA: Diagnosis not present

## 2015-09-03 DIAGNOSIS — Z1212 Encounter for screening for malignant neoplasm of rectum: Secondary | ICD-10-CM

## 2015-09-03 DIAGNOSIS — E785 Hyperlipidemia, unspecified: Secondary | ICD-10-CM | POA: Diagnosis not present

## 2015-09-03 DIAGNOSIS — Z01419 Encounter for gynecological examination (general) (routine) without abnormal findings: Secondary | ICD-10-CM

## 2015-09-03 DIAGNOSIS — K219 Gastro-esophageal reflux disease without esophagitis: Secondary | ICD-10-CM

## 2015-09-03 DIAGNOSIS — E559 Vitamin D deficiency, unspecified: Secondary | ICD-10-CM

## 2015-09-03 DIAGNOSIS — Z6832 Body mass index (BMI) 32.0-32.9, adult: Secondary | ICD-10-CM | POA: Insufficient documentation

## 2015-09-03 MED ORDER — LEVOTHYROXINE SODIUM 25 MCG PO TABS: 25.0000 ug | ORAL_TABLET | Freq: Every day | ORAL | 1 refills | Status: DC

## 2015-09-03 MED ORDER — PANTOPRAZOLE SODIUM 40 MG PO TBEC: 40.0000 mg | DELAYED_RELEASE_TABLET | Freq: Every day | ORAL | 1 refills | Status: DC

## 2015-09-03 MED ORDER — VITAMIN D (ERGOCALCIFEROL) 1.25 MG (50000 UNIT) PO CAPS: 50000.0000 [IU] | ORAL_CAPSULE | ORAL | 3 refills | Status: DC

## 2015-09-03 MED ORDER — HYDROCHLOROTHIAZIDE 25 MG PO TABS: 25.0000 mg | ORAL_TABLET | Freq: Every day | ORAL | 1 refills | Status: DC

## 2015-09-03 NOTE — Patient Instructions (Signed)
Health Maintenance, Female Adopting a healthy lifestyle and getting preventive care can go a long way to promote health and wellness. Talk with your health care provider about what schedule of regular examinations is right for you. This is a good chance for you to check in with your provider about disease prevention and staying healthy. In between checkups, there are plenty of things you can do on your own. Experts have done a lot of research about which lifestyle changes and preventive measures are most likely to keep you healthy. Ask your health care provider for more information. WEIGHT AND DIET  Eat a healthy diet  Be sure to include plenty of vegetables, fruits, low-fat dairy products, and lean protein.  Do not eat a lot of foods high in solid fats, added sugars, or salt.  Get regular exercise. This is one of the most important things you can do for your health.  Most adults should exercise for at least 150 minutes each week. The exercise should increase your heart rate and make you sweat (moderate-intensity exercise).  Most adults should also do strengthening exercises at least twice a week. This is in addition to the moderate-intensity exercise.  Maintain a healthy weight  Body mass index (BMI) is a measurement that can be used to identify possible weight problems. It estimates body fat based on height and weight. Your health care provider can help determine your BMI and help you achieve or maintain a healthy weight.  For females 20 years of age and older:   A BMI below 18.5 is considered underweight.  A BMI of 18.5 to 24.9 is normal.  A BMI of 25 to 29.9 is considered overweight.  A BMI of 30 and above is considered obese.  Watch levels of cholesterol and blood lipids  You should start having your blood tested for lipids and cholesterol at 55 years of age, then have this test every 5 years.  You may need to have your cholesterol levels checked more often if:  Your lipid  or cholesterol levels are high.  You are older than 55 years of age.  You are at high risk for heart disease.  CANCER SCREENING   Lung Cancer  Lung cancer screening is recommended for adults 55-80 years old who are at high risk for lung cancer because of a history of smoking.  A yearly low-dose CT scan of the lungs is recommended for people who:  Currently smoke.  Have quit within the past 15 years.  Have at least a 30-pack-year history of smoking. A pack year is smoking an average of one pack of cigarettes a day for 1 year.  Yearly screening should continue until it has been 15 years since you quit.  Yearly screening should stop if you develop a health problem that would prevent you from having lung cancer treatment.  Breast Cancer  Practice breast self-awareness. This means understanding how your breasts normally appear and feel.  It also means doing regular breast self-exams. Let your health care provider know about any changes, no matter how small.  If you are in your 20s or 30s, you should have a clinical breast exam (CBE) by a health care provider every 1-3 years as part of a regular health exam.  If you are 40 or older, have a CBE every year. Also consider having a breast X-ray (mammogram) every year.  If you have a family history of breast cancer, talk to your health care provider about genetic screening.  If you   are at high risk for breast cancer, talk to your health care provider about having an MRI and a mammogram every year.  Breast cancer gene (BRCA) assessment is recommended for women who have family members with BRCA-related cancers. BRCA-related cancers include:  Breast.  Ovarian.  Tubal.  Peritoneal cancers.  Results of the assessment will determine the need for genetic counseling and BRCA1 and BRCA2 testing. Cervical Cancer Your health care provider may recommend that you be screened regularly for cancer of the pelvic organs (ovaries, uterus, and  vagina). This screening involves a pelvic examination, including checking for microscopic changes to the surface of your cervix (Pap test). You may be encouraged to have this screening done every 3 years, beginning at age 21.  For women ages 30-65, health care providers may recommend pelvic exams and Pap testing every 3 years, or they may recommend the Pap and pelvic exam, combined with testing for human papilloma virus (HPV), every 5 years. Some types of HPV increase your risk of cervical cancer. Testing for HPV may also be done on women of any age with unclear Pap test results.  Other health care providers may not recommend any screening for nonpregnant women who are considered low risk for pelvic cancer and who do not have symptoms. Ask your health care provider if a screening pelvic exam is right for you.  If you have had past treatment for cervical cancer or a condition that could lead to cancer, you need Pap tests and screening for cancer for at least 20 years after your treatment. If Pap tests have been discontinued, your risk factors (such as having a new sexual partner) need to be reassessed to determine if screening should resume. Some women have medical problems that increase the chance of getting cervical cancer. In these cases, your health care provider may recommend more frequent screening and Pap tests. Colorectal Cancer  This type of cancer can be detected and often prevented.  Routine colorectal cancer screening usually begins at 55 years of age and continues through 55 years of age.  Your health care provider may recommend screening at an earlier age if you have risk factors for colon cancer.  Your health care provider may also recommend using home test kits to check for hidden blood in the stool.  A small camera at the end of a tube can be used to examine your colon directly (sigmoidoscopy or colonoscopy). This is done to check for the earliest forms of colorectal  cancer.  Routine screening usually begins at age 50.  Direct examination of the colon should be repeated every 5-10 years through 55 years of age. However, you may need to be screened more often if early forms of precancerous polyps or small growths are found. Skin Cancer  Check your skin from head to toe regularly.  Tell your health care provider about any new moles or changes in moles, especially if there is a change in a mole's shape or color.  Also tell your health care provider if you have a mole that is larger than the size of a pencil eraser.  Always use sunscreen. Apply sunscreen liberally and repeatedly throughout the day.  Protect yourself by wearing long sleeves, pants, a wide-brimmed hat, and sunglasses whenever you are outside. HEART DISEASE, DIABETES, AND HIGH BLOOD PRESSURE   High blood pressure causes heart disease and increases the risk of stroke. High blood pressure is more likely to develop in:  People who have blood pressure in the high end   of the normal range (130-139/85-89 mm Hg).  People who are overweight or obese.  People who are African American.  If you are 38-23 years of age, have your blood pressure checked every 3-5 years. If you are 61 years of age or older, have your blood pressure checked every year. You should have your blood pressure measured twice--once when you are at a hospital or clinic, and once when you are not at a hospital or clinic. Record the average of the two measurements. To check your blood pressure when you are not at a hospital or clinic, you can use:  An automated blood pressure machine at a pharmacy.  A home blood pressure monitor.  If you are between 45 years and 39 years old, ask your health care provider if you should take aspirin to prevent strokes.  Have regular diabetes screenings. This involves taking a blood sample to check your fasting blood sugar level.  If you are at a normal weight and have a low risk for diabetes,  have this test once every three years after 55 years of age.  If you are overweight and have a high risk for diabetes, consider being tested at a younger age or more often. PREVENTING INFECTION  Hepatitis B  If you have a higher risk for hepatitis B, you should be screened for this virus. You are considered at high risk for hepatitis B if:  You were born in a country where hepatitis B is common. Ask your health care provider which countries are considered high risk.  Your parents were born in a high-risk country, and you have not been immunized against hepatitis B (hepatitis B vaccine).  You have HIV or AIDS.  You use needles to inject street drugs.  You live with someone who has hepatitis B.  You have had sex with someone who has hepatitis B.  You get hemodialysis treatment.  You take certain medicines for conditions, including cancer, organ transplantation, and autoimmune conditions. Hepatitis C  Blood testing is recommended for:  Everyone born from [birth year] through 1965.  Anyone with known risk factors for hepatitis C. Sexually transmitted infections (STIs)  You should be screened for sexually transmitted infections (STIs) including gonorrhea and chlamydia if:  You are sexually active and are younger than 55 years of age.  You are older than 55 years of age and your health care provider tells you that you are at risk for this type of infection.  Your sexual activity has changed since you were last screened and you are at an increased risk for chlamydia or gonorrhea. Ask your health care provider if you are at risk.  If you do not have HIV, but are at risk, it may be recommended that you take a prescription medicine daily to prevent HIV infection. This is called pre-exposure prophylaxis (PrEP). You are considered at risk if:  You are sexually active and do not regularly use condoms or know the HIV status of your partner(s).  You take drugs by injection.  You are sexually  active with a partner who has HIV. Talk with your health care provider about whether you are at high risk of being infected with HIV. If you choose to begin PrEP, you should first be tested for HIV. You should then be tested every 3 months for as long as you are taking PrEP.  PREGNANCY   If you are premenopausal and you may become pregnant, ask your health care provider about preconception counseling.  If you may  become pregnant, take 400 to 800 micrograms (mcg) of folic acid every day.  If you want to prevent pregnancy, talk to your health care provider about birth control (contraception). OSTEOPOROSIS AND MENOPAUSE   Osteoporosis is a disease in which the bones lose minerals and strength with aging. This can result in serious bone fractures. Your risk for osteoporosis can be identified using a bone density scan.  If you are 61 years of age or older, or if you are at risk for osteoporosis and fractures, ask your health care provider if you should be screened.  Ask your health care provider whether you should take a calcium or vitamin D supplement to lower your risk for osteoporosis.  Menopause may have certain physical symptoms and risks.  Hormone replacement therapy may reduce some of these symptoms and risks. Talk to your health care provider about whether hormone replacement therapy is right for you.  HOME CARE INSTRUCTIONS   Schedule regular health, dental, and eye exams.  Stay current with your immunizations.   Do not use any tobacco products including cigarettes, chewing tobacco, or electronic cigarettes.  If you are pregnant, do not drink alcohol.  If you are breastfeeding, limit how much and how often you drink alcohol.  Limit alcohol intake to no more than 1 drink per day for nonpregnant women. One drink equals 12 ounces of beer, 5 ounces of wine, or 1 ounces of hard liquor.  Do not use street drugs.  Do not share needles.  Ask your health care provider for help if  you need support or information about quitting drugs.  Tell your health care provider if you often feel depressed.  Tell your health care provider if you have ever been abused or do not feel safe at home.   This information is not intended to replace advice given to you by your health care provider. Make sure you discuss any questions you have with your health care provider.   Document Released: 08/12/2010 Document Revised: 02/17/2014 Document Reviewed: 12/29/2012 Elsevier Interactive Patient Education [employer].

## 2015-09-03 NOTE — Progress Notes (Signed)
Subjective:    Patient ID: Pamela [name], female    DOB: 1960/02/17, 55 y.o.   MRN: [id number]  Patient here today for Annual Physical exam, PAP and follow up of chronic medical problems. She says that she is doing well today without complaints.  Outpatient Encounter Prescriptions as of 09/03/2015  Medication Sig  . hydrochlorothiazide (HYDRODIURIL) 25 MG tablet Take 1 tablet (25 mg total) by mouth daily.  Marland Kitchen ibuprofen (ADVIL,MOTRIN) 200 MG tablet Take 200 mg by mouth every 6 (six) hours as needed for pain. Reported on 05/12/2015  . levothyroxine (SYNTHROID, LEVOTHROID) 25 MCG tablet Take 1 tablet (25 mcg total) by mouth daily before breakfast.  . pantoprazole (PROTONIX) 40 MG tablet Take 1 tablet (40 mg total) by mouth daily. 30 minutes before breakfast  . Vitamin D, Ergocalciferol, (DRISDOL) 50000 units CAPS capsule Take 1 capsule (50,000 Units total) by mouth every 7 (seven) days.     Hypertension  This is a chronic problem. The current episode started more than 1 year ago. The problem is unchanged. The problem is controlled. Pertinent negatives include no anxiety, headaches, palpitations, peripheral edema or shortness of breath. Risk factors for coronary artery disease include dyslipidemia, obesity, post-menopausal state, sedentary lifestyle and family history. Past treatments include nothing. The current treatment provides mild improvement. There is no history of kidney disease, CAD/MI, CVA, heart failure or a thyroid problem. There is no history of sleep apnea.  Hyperlipidemia  This is a chronic problem. The current episode started more than 1 year ago. The problem is uncontrolled. Recent lipid tests were reviewed and are high. Exacerbating diseases include obesity. Pertinent negatives include no leg pain or shortness of breath. Current antihyperlipidemic treatment includes statins. The current treatment provides moderate improvement of lipids. Risk factors for coronary artery disease include  dyslipidemia, hypertension, obesity, post-menopausal and family history.  Thyroid Problem  Presents for follow-up visit. Patient reports no anxiety, depressed mood, dry skin, hoarse voice or palpitations. The symptoms have been stable. Past treatments include levothyroxine. The treatment provided significant relief. Her past medical history is significant for hyperlipidemia. There is no history of heart failure.  GERD Takes protonix daily- really works well to keep symptoms under control. Vitamin D def Takes vitamin d 50000u weekly- does well to remember to take- no side effects.    Review of Systems  Constitutional: Negative.   HENT: Negative.  Negative for hoarse voice.   Eyes: Negative.   Respiratory: Negative.  Negative for shortness of breath.   Cardiovascular: Negative.  Negative for palpitations.  Gastrointestinal: Negative.   Endocrine: Negative.   Genitourinary: Negative.   Musculoskeletal: Negative.   Neurological: Negative.  Negative for headaches.  Hematological: Negative.   Psychiatric/Behavioral: Negative.  The patient is not nervous/anxious.   All other systems reviewed and are negative.      Objective:   Physical Exam  Constitutional: She is oriented to person, place, and time. She appears well-developed and well-nourished. No distress.  HENT:  Head: Normocephalic and atraumatic.  Right Ear: Hearing, tympanic membrane, external ear and ear canal normal.  Left Ear: Hearing, tympanic membrane, external ear and ear canal normal.  Nose: Nose normal.  Mouth/Throat: Uvula is midline and oropharynx is clear and moist.  Nasal passage erythemas with mild swelling    Eyes: Conjunctivae and EOM are normal. Pupils are equal, round, and reactive to [name].  Neck: Normal range of motion and full passive range of motion without pain. Neck supple. No JVD present. Carotid bruit is  not present. No thyroid mass and no thyromegaly present.  Cardiovascular: Normal rate, regular  rhythm, normal heart sounds and intact distal pulses.   No murmur heard. Pulmonary/Chest: Effort normal and breath sounds normal. No respiratory distress. She has no wheezes. Right breast exhibits no inverted nipple, no mass, no nipple discharge, no skin change and no tenderness. Left breast exhibits no inverted nipple, no mass, no nipple discharge, no skin change and no tenderness.  Abdominal: Soft. Bowel sounds are normal. She exhibits no distension and no mass. There is no tenderness.  Genitourinary: Uterus normal. No breast swelling, tenderness, discharge or bleeding. Vaginal discharge found.  Genitourinary Comments: bimanual exam-No adnexal masses or tenderness. Cervix parous and pink  Musculoskeletal: Normal range of motion. She exhibits no edema or tenderness.  Lymphadenopathy:    She has no cervical adenopathy.  Neurological: She is alert and oriented to person, place, and time. She has normal reflexes. No cranial nerve deficit.  Skin: Skin is warm and dry.  Psychiatric: She has a normal mood and affect. Her behavior is normal. Judgment and thought content normal.  Vitals reviewed. EKG- NSR-Pamela Hassell Done, FNP  Chest x ray- no cardiopulmonary abnormalities-Preliminary reading by Ronnald Collum, FNP  [facility]     BP 133/78 (BP Location: Left Arm)   Pulse 71   Temp 97.4 F (36.3 C) (Oral)   Ht 5' (1.524 m)   Wt 177 lb (80.3 kg)   BMI 34.57 kg/m      Assessment & Plan:  1. Essential hypertension Do not add salt to diet - hydrochlorothiazide (HYDRODIURIL) 25 MG tablet; Take 1 tablet (25 mg total) by mouth daily.  Dispense: 90 tablet; Refill: 1 - CMP14+EGFR - CMP14+EGFR  2. Annual physical exam - CBC with Differential/Platelet - DG Chest 2 View; Future - EKG 12-Lead  3. Gastroesophageal reflux disease, esophagitis presence not specified Avoid spicy foods Do not eat 2 hours prior to bedtime - pantoprazole (PROTONIX) 40 MG tablet; Take 1 tablet (40 mg total) by mouth  daily. 30 minutes before breakfast  Dispense: 90 tablet; Refill: 1  4. Encounter for routine gynecological examination - Pap IG w/ reflex to HPV when ASC-U  5. Hypothyroidism, unspecified hypothyroidism type - levothyroxine (SYNTHROID, LEVOTHROID) 25 MCG tablet; Take 1 tablet (25 mcg total) by mouth daily before breakfast.  Dispense: 90 tablet; Refill: 1 - Thyroid Panel With TSH  6. Hyperlipidemia Low fat diet - Lipid panel - Lipid panel  7. Vitamin D deficiency - Vitamin D, Ergocalciferol, (DRISDOL) 50000 units CAPS capsule; Take 1 capsule (50,000 Units total) by mouth every 7 (seven) days.  Dispense: 12 capsule; Refill: 3 - VITAMIN D 25 Hydroxy (Vit-D Deficiency, Fractures) - VITAMIN D 25 Hydroxy (Vit-D Deficiency, Fractures)  8. BMI 34.0-34.9,adult Discussed diet and exercise for person with BMI >25 Will recheck weight in 3-6 months     Labs pending Health maintenance reviewed Diet and exercise encouraged Continue all meds Follow up  In 6 months   Brooks, FNP

## 2015-09-04 ENCOUNTER — Other Ambulatory Visit: Payer: Self-pay | Admitting: Nurse Practitioner

## 2015-09-04 LAB — PAP IG W/ RFLX HPV ASCU: PAP Smear Comment: 0

## 2015-09-04 LAB — CBC WITH DIFFERENTIAL/PLATELET
Basophils Absolute: 0 10*3/uL (ref 0.0–0.2)
Basos: 1 %
EOS (ABSOLUTE): 0.1 10*3/uL (ref 0.0–0.4)
Eos: 2 %
Hematocrit: 39.6 % (ref 34.0–46.6)
Hemoglobin: 13.3 g/dL (ref 11.1–15.9)
Immature Grans (Abs): 0 10*3/uL (ref 0.0–0.1)
Immature Granulocytes: 0 %
Lymphocytes Absolute: 1.2 10*3/uL (ref 0.7–3.1)
Lymphs: 21 %
MCH: 30.2 pg (ref 26.6–33.0)
MCHC: 33.6 g/dL (ref 31.5–35.7)
MCV: 90 fL (ref 79–97)
Monocytes Absolute: 0.3 10*3/uL (ref 0.1–0.9)
Monocytes: 6 %
Neutrophils Absolute: 3.9 10*3/uL (ref 1.4–7.0)
Neutrophils: 70 %
Platelets: 238 10*3/uL (ref 150–379)
RBC: 4.4 x10E6/uL (ref 3.77–5.28)
RDW: 13 % (ref 12.3–15.4)
WBC: 5.6 10*3/uL (ref 3.4–10.8)

## 2015-09-04 LAB — THYROID PANEL WITH TSH
Free Thyroxine Index: 2.5 (ref 1.2–4.9)
T3 Uptake Ratio: 26 % (ref 24–39)
T4, Total: 9.8 ug/dL (ref 4.5–12.0)
TSH: 1.89 u[IU]/mL (ref 0.450–4.500)

## 2015-09-04 LAB — CMP14+EGFR
ALT: 38 IU/L — ABNORMAL HIGH (ref 0–32)
AST: 25 IU/L (ref 0–40)
Albumin/Globulin Ratio: 1.6 (ref 1.2–2.2)
Albumin: 4.2 g/dL (ref 3.5–5.5)
Alkaline Phosphatase: 81 IU/L (ref 39–117)
BUN/Creatinine Ratio: 15 (ref 9–23)
BUN: 12 mg/dL (ref 6–24)
Bilirubin Total: 0.4 mg/dL (ref 0.0–1.2)
CO2: 30 mmol/L — ABNORMAL HIGH (ref 18–29)
Calcium: 9.3 mg/dL (ref 8.7–10.2)
Chloride: 94 mmol/L — ABNORMAL LOW (ref 96–106)
Creatinine, Ser: 0.8 mg/dL (ref 0.57–1.00)
GFR calc Af Amer: 96 mL/min/{1.73_m2} (ref >59)
GFR calc non Af Amer: 83 mL/min/{1.73_m2} (ref >59)
Globulin, Total: 2.6 g/dL (ref 1.5–4.5)
Glucose: 100 mg/dL — ABNORMAL HIGH (ref 65–99)
Potassium: 2.5 mmol/L — ABNORMAL LOW (ref 3.5–5.2)
Sodium: 142 mmol/L (ref 134–144)
Total Protein: 6.8 g/dL (ref 6.0–8.5)

## 2015-09-04 LAB — LIPID PANEL
Chol/HDL Ratio: 4 ratio units (ref 0.0–4.4)
Cholesterol, Total: 231 mg/dL — ABNORMAL HIGH (ref 100–199)
HDL: 58 mg/dL (ref >39)
LDL Calculated: 148 mg/dL — ABNORMAL HIGH (ref 0–99)
Triglycerides: 123 mg/dL (ref 0–149)
VLDL Cholesterol Cal: 25 mg/dL (ref 5–40)

## 2015-09-04 LAB — VITAMIN D 25 HYDROXY (VIT D DEFICIENCY, FRACTURES): Vit D, 25-Hydroxy: 50 ng/mL (ref 30.0–100.0)

## 2015-09-04 MED ORDER — POTASSIUM CHLORIDE CRYS ER 20 MEQ PO TBCR: 20.0000 meq | EXTENDED_RELEASE_TABLET | Freq: Two times a day (BID) | ORAL | 3 refills | Status: DC

## 2015-09-05 ENCOUNTER — Ambulatory Visit (HOSPITAL_COMMUNITY)
Admission: RE | Admit: 2015-09-05 | Discharge: 2015-09-05 | Disposition: A | Discharge status: Home / Self Care | Payer: BLUE CROSS/BLUE SHIELD | Source: Ambulatory Visit | Attending: Gastroenterology | Admitting: Gastroenterology

## 2015-09-05 ENCOUNTER — Encounter (HOSPITAL_COMMUNITY): Payer: Self-pay | Admitting: *Deleted

## 2015-09-05 ENCOUNTER — Encounter (HOSPITAL_COMMUNITY)
Admission: RE | Disposition: A | Discharge status: Home / Self Care | Payer: Self-pay | Source: Ambulatory Visit | Attending: Gastroenterology

## 2015-09-05 DIAGNOSIS — K635 Polyp of colon: Secondary | ICD-10-CM | POA: Diagnosis not present

## 2015-09-05 DIAGNOSIS — Z86010 Personal history of colonic polyps: Secondary | ICD-10-CM | POA: Diagnosis not present

## 2015-09-05 DIAGNOSIS — K219 Gastro-esophageal reflux disease without esophagitis: Secondary | ICD-10-CM | POA: Diagnosis not present

## 2015-09-05 DIAGNOSIS — R1310 Dysphagia, unspecified: Secondary | ICD-10-CM | POA: Diagnosis not present

## 2015-09-05 DIAGNOSIS — E079 Disorder of thyroid, unspecified: Secondary | ICD-10-CM | POA: Diagnosis not present

## 2015-09-05 DIAGNOSIS — K648 Other hemorrhoids: Secondary | ICD-10-CM | POA: Insufficient documentation

## 2015-09-05 DIAGNOSIS — Z1211 Encounter for screening for malignant neoplasm of colon: Secondary | ICD-10-CM | POA: Diagnosis not present

## 2015-09-05 HISTORY — PX: POLYPECTOMY: SHX5525

## 2015-09-05 HISTORY — PX: COLONOSCOPY: SHX5424

## 2015-09-05 SURGERY — COLONOSCOPY
Anesthesia: Moderate Sedation

## 2015-09-05 MED ORDER — MIDAZOLAM HCL 5 MG/5ML IJ SOLN
INTRAMUSCULAR | Status: DC | PRN
  Administered 2015-09-05 (×2): 2 mg via INTRAVENOUS

## 2015-09-05 MED ORDER — SODIUM CHLORIDE 0.9 % IV SOLN
INTRAVENOUS | Status: DC
  Administered 2015-09-05: 1000 mL via INTRAVENOUS

## 2015-09-05 MED ORDER — MEPERIDINE HCL 100 MG/ML IJ SOLN
INTRAMUSCULAR | Status: DC | PRN
  Administered 2015-09-05: 50 mg via INTRAVENOUS
  Administered 2015-09-05: 25 mg via INTRAVENOUS

## 2015-09-05 MED ORDER — MIDAZOLAM HCL 5 MG/5ML IJ SOLN
INTRAMUSCULAR | Status: AC
  Filled 2015-09-05: qty 10

## 2015-09-05 MED ORDER — LIDOCAINE VISCOUS 2 % MT SOLN
OROMUCOSAL | Status: AC
  Filled 2015-09-05: qty 15

## 2015-09-05 MED ORDER — MEPERIDINE HCL 100 MG/ML IJ SOLN
INTRAMUSCULAR | Status: AC
  Filled 2015-09-05: qty 2

## 2015-09-05 MED ORDER — STERILE WATER FOR IRRIGATION IR SOLN
Status: DC | PRN
  Administered 2015-09-05: 14:00:00

## 2015-09-05 MED ORDER — PANTOPRAZOLE SODIUM 40 MG PO TBEC: 40.0000 mg | DELAYED_RELEASE_TABLET | Freq: Every day | ORAL | 3 refills | Status: DC

## 2015-09-05 NOTE — H&P (Addendum)
  Primary Care Physician:  Chevis Pretty, FNP Primary Gastroenterologist:  Dr. Oneida Alar  Pre-Procedure History & Physical: HPI:  Pamela [name] is a 55 y.o. female here for DYSPHAGIA resolved AFTER STARTING PROTONIX/ PERSONAL HISTORY OF POLYPS.  Past Medical History:  Diagnosis Date  . GERD (gastroesophageal reflux disease)    heartburn  . Headache(784.0)    migraines  . Hyperlipidemia   . Ovarian cyst   . Thyroid disease     Past Surgical History:  Procedure Laterality Date  . CESAREAN SECTION    . COLONOSCOPY N/A 07/30/2012   Dr. Oneida Alar: 3 simple adenomas, surveillance 2017  . TUBAL LIGATION      Prior to Admission medications   Medication Sig Start Date End Date Taking? Authorizing Provider  hydrochlorothiazide (HYDRODIURIL) 25 MG tablet Take 1 tablet (25 mg total) by mouth daily. 09/03/15  Yes Mary-Margaret Hassell Done, FNP  ibuprofen (ADVIL,MOTRIN) 200 MG tablet Take 200 mg by mouth every 6 (six) hours as needed for pain. Reported on 05/12/2015   Yes Historical Provider, MD  levothyroxine (SYNTHROID, LEVOTHROID) 25 MCG tablet Take 1 tablet (25 mcg total) by mouth daily before breakfast. 09/03/15  Yes Mary-Margaret Hassell Done, FNP  pantoprazole (PROTONIX) 40 MG tablet Take 1 tablet (40 mg total) by mouth daily. 30 minutes before breakfast 09/03/15  Yes Mary-Margaret Hassell Done, FNP  Vitamin D, Ergocalciferol, (DRISDOL) 50000 units CAPS capsule Take 1 capsule (50,000 Units total) by mouth every 7 (seven) days. 09/03/15  Yes Mary-Margaret Hassell Done, FNP  potassium chloride SA (K-DUR,KLOR-CON) 20 MEQ tablet Take 1 tablet (20 mEq total) by mouth 2 (two) times daily. 09/04/15   Mary-Margaret Hassell Done, FNP    Allergies as of 08/17/2015 - Review Complete 08/17/2015  Allergen Reaction Noted  . Iodine Hives 07/21/2012    Family History  Problem Relation Age of Onset  . Diabetes Mother   . Heart disease Mother   . Colon cancer Neg Hx   . Colon polyps Neg Hx     Social History   Social History  .  Marital status: Married    Spouse name: N/A  . Number of children: N/A  . Years of education: N/A   Occupational History  . Not on file.   Social History Main Topics  . Smoking status: Never Smoker  . Smokeless tobacco: Never Used     Comment: Never smoked  . Alcohol use No  . Drug use: No  . Sexual activity: Yes   Other Topics Concern  . Not on file   Social History Narrative  . No narrative on file    Review of Systems: See HPI, otherwise negative ROS   Physical Exam: Pulse 79   Temp 98.7 F (37.1 C) (Oral)   Resp 13   Ht 5' (1.524 m)   Wt 172 lb (78 kg)   SpO2 98%   BMI 33.59 kg/m  General:   Alert,  pleasant and cooperative in NAD Head:  Normocephalic and atraumatic. Neck:  Supple; Lungs:  Clear throughout to auscultation.    Heart:  Regular rate and rhythm. Abdomen:  Soft, nontender and nondistended. Normal bowel sounds, without guarding, and without rebound.   Neurologic:  Alert and  oriented x4;  grossly normal neurologically.  Impression/Plan:     DYSPHAGIA RESOLVED/screening  PLAN:  PT DECLINED EGD/DIL TODAY. TCS TODAY. DISCUSSED PROCEDURE, BENEFITS, & RISKS: < 1% chance of medication reaction, bleeding, perforation, or rupture of spleen/liver.

## 2015-09-05 NOTE — Discharge Instructions (Signed)
You had 4 small polyps removed. You have MODERATE SIZE internal hemorrhoids.   DRINK WATER TO KEEP YOUR URINE LIGHT YELLOW.  CONTINUE YOUR WEIGHT LOSS EFFORTS. LOSE TEN POUNDS.  FOLLOW A HIGH FIBER DIET. AVOID ITEMS THAT CAUSE BLOATING & GAS. SEE INFO BELOW.  CONTINUE PROTONIX. TAKE 30 MINUTES PRIOR TO BREAKFAST FOREVER.  USE PREPARATION H FOUR TIMES  A DAY IF NEEDED TO RELIEVE RECTAL PAIN/PRESSURE/BLEEDING.  YOUR BIOPSY RESULTS WILL BE AVAILABLE IN MY CHART AFTER JUL 29 AND MY OFFICE WILL CONTACT YOU IN 10-14 DAYS WITH YOUR RESULTS.   FOLLOW UP IN 6-12 MOS. PLEASE CALL WITH QUESTIONS OR CONCERNS RE: SWALLOWING.  Next colonoscopy in 5 years.   Colonoscopy Care After Read the instructions outlined below and refer to this sheet in the next week. These discharge instructions provide you with general information on caring for yourself after you leave the hospital. While your treatment has been planned according to the most current medical practices available, unavoidable complications occasionally occur. If you have any problems or questions after discharge, call DR. Shiri Hodapp, [phone].  ACTIVITY  You may resume your regular activity, but move at a slower pace for the next 24 hours.   Take frequent rest periods for the next 24 hours.   Walking will help get rid of the air and reduce the bloated feeling in your belly (abdomen).   No driving for 24 hours (because of the medicine (anesthesia) used during the test).   You may shower.   Do not sign any important legal documents or operate any machinery for 24 hours (because of the anesthesia used during the test).    NUTRITION  Drink plenty of fluids.   You may resume your normal diet as instructed by your doctor.   Begin with a light meal and progress to your normal diet. Heavy or fried foods are harder to digest and may make you feel sick to your stomach (nauseated).   Avoid alcoholic beverages for 24 hours or as instructed.      MEDICATIONS  You may resume your normal medications.   WHAT YOU CAN EXPECT TODAY  Some feelings of bloating in the abdomen.   Passage of more gas than usual.   Spotting of blood in your stool or on the toilet paper  .  IF YOU HAD POLYPS REMOVED DURING THE COLONOSCOPY:  Eat a soft diet IF YOU HAVE NAUSEA, BLOATING, ABDOMINAL PAIN, OR VOMITING.    FINDING OUT THE RESULTS OF YOUR TEST Not all test results are available during your visit. DR. Oneida Alar WILL CALL YOU WITHIN 14 DAYS OF YOUR PROCEDUE WITH YOUR RESULTS. Do not assume everything is normal if you have not heard from DR. Comer Devins, CALL HER OFFICE AT [phone].  SEEK IMMEDIATE MEDICAL ATTENTION AND CALL THE OFFICE: [phone] IF:  You have more than a spotting of blood in your stool.   Your belly is swollen (abdominal distention).   You are nauseated or vomiting.   You have a temperature over 101F.   You have abdominal pain or discomfort that is severe or gets worse throughout the day.   High-Fiber Diet A high-fiber diet changes your normal diet to include more whole grains, legumes, fruits, and vegetables. Changes in the diet involve replacing refined carbohydrates with unrefined foods. The calorie level of the diet is essentially unchanged. The Dietary Reference Intake (recommended amount) for adult males is 38 grams per day. For adult females, it is 25 grams per day. Pregnant and lactating women  should consume 28 grams of fiber per day. Fiber is the intact part of a plant that is not broken down during digestion. Functional fiber is fiber that has been isolated from the plant to provide a beneficial effect in the body. PURPOSE  Increase stool bulk.   Ease and regulate bowel movements.   Lower cholesterol.   REDUCE RISK OF COLON CANCER  INDICATIONS THAT YOU NEED MORE FIBER  Constipation and hemorrhoids.   Uncomplicated diverticulosis (intestine condition) and irritable bowel syndrome.   Weight  management.   As a protective measure against hardening of the arteries (atherosclerosis), diabetes, and cancer.   GUIDELINES FOR INCREASING FIBER IN THE DIET  Start adding fiber to the diet slowly. A gradual increase of about 5 more grams (2 slices of whole-wheat bread, 2 servings of most fruits or vegetables, or 1 bowl of high-fiber cereal) per day is best. Too rapid an increase in fiber may result in constipation, flatulence, and bloating.   Drink enough water and fluids to keep your urine clear or pale yellow. Water, juice, or caffeine-free drinks are recommended. Not drinking enough fluid may cause constipation.   Eat a variety of high-fiber foods rather than one type of fiber.   Try to increase your intake of fiber through using high-fiber foods rather than fiber pills or supplements that contain small amounts of fiber.   The goal is to change the types of food eaten. Do not supplement your present diet with high-fiber foods, but replace foods in your present diet.   INCLUDE A VARIETY OF FIBER SOURCES  Replace refined and processed grains with whole grains, canned fruits with fresh fruits, and incorporate other fiber sources. White rice, white breads, and most bakery goods contain little or no fiber.   Brown whole-grain rice, buckwheat oats, and many fruits and vegetables are all good sources of fiber. These include: broccoli, Brussels sprouts, cabbage, cauliflower, beets, sweet potatoes, white potatoes (skin on), carrots, tomatoes, eggplant, squash, berries, fresh fruits, and dried fruits.   Cereals appear to be the richest source of fiber. Cereal fiber is found in whole grains and bran. Bran is the fiber-rich outer coat of cereal grain, which is largely removed in refining. In whole-grain cereals, the bran remains. In breakfast cereals, the largest amount of fiber is found in those with "bran" in their names. The fiber content is sometimes indicated on the label.   You may need to  include additional fruits and vegetables each day.   In baking, for 1 cup white flour, you may use the following substitutions:   1 cup whole-wheat flour minus 2 tablespoons.   1/2 cup white flour plus 1/2 cup whole-wheat flour.   Polyps, Colon  A polyp is extra tissue that grows inside your body. Colon polyps grow in the large intestine. The large intestine, also called the colon, is part of your digestive system. It is a long, hollow tube at the end of your digestive tract where your body makes and stores stool. Most polyps are not dangerous. They are benign. This means they are not cancerous. But over time, some types of polyps can turn into cancer. Polyps that are smaller than a pea are usually not harmful. But larger polyps could someday become or may already be cancerous. To be safe, doctors remove all polyps and test them.   PREVENTION There is not one sure way to prevent polyps. You might be able to lower your risk of getting them if you:  Eat more  fruits and vegetables and less fatty food.   Do not smoke.   Avoid alcohol.   Exercise every day.   Lose weight if you are overweight.   Eating more calcium and folate can also lower your risk of getting polyps. Some foods that are rich in calcium are milk, cheese, and broccoli. Some foods that are rich in folate are chickpeas, kidney beans, and spinach.   Hemorrhoids Hemorrhoids are dilated (enlarged) veins around the rectum. Sometimes clots will form in the veins. This makes them swollen and painful. These are called thrombosed hemorrhoids. Causes of hemorrhoids include:  Constipation.   Straining to have a bowel movement.   HEAVY LIFTING  HOME CARE INSTRUCTIONS  Eat a well balanced diet and drink 6 to 8 glasses of water every day to avoid constipation. You may also use a bulk laxative.   Avoid straining to have bowel movements.   Keep anal area dry and clean.   Do not use a donut shaped pillow or sit on the toilet  for long periods. This increases blood pooling and pain.   Move your bowels when your body has the urge; this will require less straining and will decrease pain and pressure.

## 2015-09-05 NOTE — Op Note (Signed)
[facility] Patient Name: Pamela [name] Procedure Date: 09/05/2015 1:37 PM MRN: [id number] Date of Birth: 26-Dec-1960 Attending MD: Barney Drain , MD CSN: [id number] Age: 55 Admit Type: Outpatient Procedure:                Colonoscopy with CODL FORCEPS POLYPECTOMY Indications:              High risk colon cancer surveillance: Personal                            history of colonic polyps Providers:                Barney Drain, MD, Renda Rolls, RN, Randa Spike,                            Technician Referring MD:              Medicines:                Meperidine 75 mg IV, Midazolam 5 mg IV Complications:            No immediate complications. Estimated Blood Loss:     Estimated blood loss was minimal. Procedure:                Pre-Anesthesia Assessment:                           - Prior to the procedure, a History and Physical                            was performed, and patient medications and                            allergies were reviewed. The patient's tolerance of                            previous anesthesia was also reviewed. The risks                            and benefits of the procedure and the sedation                            options and risks were discussed with the patient.                            All questions were answered, and informed consent                            was obtained. Prior Anticoagulants: The patient has                            taken ibuprofen, last dose was day of procedure.                            ASA Grade Assessment: II - A patient with mild  systemic disease. After reviewing the risks and                            benefits, the patient was deemed in satisfactory                            condition to undergo the procedure. After obtaining                            informed consent, the colonoscope was passed under                            direct vision. Throughout the procedure, the               patient's blood pressure, pulse, and oxygen                            saturations were monitored continuously. The                            [id number]) was introduced through the anus                            and advanced to the the cecum, identified by                            appendiceal orifice and ileocecal valve. The                            ileocecal valve, appendiceal orifice, and rectum                            were photographed. The colonoscopy was performed                            without difficulty. The patient tolerated the                            procedure well. The quality of the bowel                            preparation was excellent. Scope In: 1:58:28 PM Scope Out: 2:12:25 PM Scope Withdrawal Time: 0 hours 12 minutes 1 second  Total Procedure Duration: 0 hours 13 minutes 57 seconds  Findings:      Four sessile polyps were found in the rectum and sigmoid colon. The       polyps were 2 to 3 mm in size. These polyps were removed with a cold       biopsy forceps. Resection and retrieval were complete.      The exam was otherwise without abnormality.      Non-bleeding internal hemorrhoids were found. The hemorrhoids were       moderate. Impression:               - Four 2 to 3 mm polyps in the rectum and in the  sigmoid colon, removed with a cold biopsy forceps.                            Resected and retrieved.                           - The examination was otherwise normal.                           - Non-bleeding internal hemorrhoids. Moderate Sedation:      Moderate (conscious) sedation was administered by the endoscopy nurse       and supervised by the endoscopist. The following parameters were       monitored: oxygen saturation, heart rate, blood pressure, and response       to care. Total physician intraservice time was 27 minutes. Recommendation:           - High fiber diet.                           -  Continue present medications.                           - Await pathology results.                           - Repeat colonoscopy in 3 - 5 years for                            surveillance.                           DRINK WATER TO KEEP YOUR URINE LIGHT YELLOW.                           CONTINUE YOUR WEIGHT LOSS EFFORTS. LOSE TEN POUNDS.                           FOLLOW A HIGH FIBER DIET. AVOID ITEMS THAT CAUSE                            BLOATING & GAS. SEE INFO BELOW.                           CONTINUE PROTONIX. TAKE 30 MINUTES PRIOR TO                            BREAKFAST FOREVER.                           USE PREPARATION H FOUR TIMES A DAY IF NEEDED TO                            RELIEVE RECTAL PAIN/PRESSURE/BLEEDING.                           FOLLOW UP IN 6-12 MOS. PLEASE CALL WITH QUESTIONS  OR CONCERNS RE: SWALLOWING.                           - Patient has a contact number available for                            emergencies. The signs and symptoms of potential                            delayed complications were discussed with the                            patient. Return to normal activities tomorrow.                            Written discharge instructions were provided to the                            patient. Procedure Code(s):        --- Professional ---                           [phone], Colonoscopy, flexible; with biopsy, single                            or multiple                           99152, Moderate sedation services provided by the                            same physician or other qualified health care                            professional performing the diagnostic or                            therapeutic service that the sedation supports,                            requiring the presence of an independent trained                            observer to assist in the monitoring of the                            patient's level of  consciousness and physiological                            status; initial 15 minutes of intraservice time,                            patient age 71 years or older  99153, Moderate sedation services; each additional                            15 minutes intraservice time Diagnosis Code(s):        --- Professional ---                           Z86.010, Personal history of colonic polyps                           K62.1, Rectal polyp                           D12.5, Benign neoplasm of sigmoid colon                           K64.8, Other hemorrhoids CPT copyright 2016 [employer]. All rights reserved. The codes documented in this report are preliminary and upon coder review may  be revised to meet current compliance requirements. Barney Drain, MD Barney Drain, MD 09/05/2015 2:32:53 PM This report has been signed electronically. Number of Addenda: 0

## 2015-09-09 ENCOUNTER — Telehealth: Payer: Self-pay | Admitting: Gastroenterology

## 2015-09-09 NOTE — Telephone Encounter (Signed)
Please call pt. She had HYPERPLASTIC POLYPS removed.   DRINK WATER TO KEEP YOUR URINE LIGHT YELLOW.  CONTINUE YOUR WEIGHT LOSS EFFORTS. LOSE TEN POUNDS.  FOLLOW A HIGH FIBER DIET. AVOID ITEMS THAT CAUSE BLOATING & GAS.   CONTINUE PROTONIX. TAKE 30 MINUTES PRIOR TO BREAKFAST FOREVER.  USE PREPARATION H FOUR TIMES  A DAY IF NEEDED TO RELIEVE RECTAL PAIN/PRESSURE/BLEEDING.  FOLLOW UP IN 6-12 MOS. PLEASE CALL WITH QUESTIONS OR CONCERNS RE: SWALLOWING.  Next colonoscopy in 5 years.

## 2015-09-10 NOTE — Telephone Encounter (Signed)
Reminder in epic °

## 2015-09-10 NOTE — Telephone Encounter (Signed)
Tried to call with no answer  

## 2015-09-18 ENCOUNTER — Encounter (HOSPITAL_COMMUNITY): Payer: Self-pay | Admitting: Gastroenterology

## 2015-10-08 ENCOUNTER — Encounter: Payer: Self-pay | Admitting: Nurse Practitioner

## 2015-10-08 ENCOUNTER — Ambulatory Visit (INDEPENDENT_AMBULATORY_CARE_PROVIDER_SITE_OTHER): Payer: BLUE CROSS/BLUE SHIELD | Admitting: Nurse Practitioner

## 2015-10-08 VITALS — BP 113/81 | HR 75 | Temp 97.0°F | Ht 60.0 in | Wt 179.0 lb

## 2015-10-08 DIAGNOSIS — L03032 Cellulitis of left toe: Secondary | ICD-10-CM

## 2015-10-08 MED ORDER — CIPROFLOXACIN HCL 500 MG PO TABS: 500.0000 mg | ORAL_TABLET | Freq: Two times a day (BID) | ORAL | 0 refills | Status: DC

## 2015-10-08 NOTE — Patient Instructions (Signed)
Paronychia °Paronychia is an infection of the skin that surrounds a nail. It usually affects the skin around a fingernail, but it may also occur near a toenail. It often causes pain and swelling around the nail. This condition may come on suddenly or develop over a longer period. In some cases, a collection of pus (abscess) can form near or under the nail. Usually, paronychia is not serious and it clears up with treatment. °CAUSES °This condition may be caused by bacteria or fungi. It is commonly caused by either Streptococcus or Staphylococcus bacteria. The bacteria or fungi often cause the infection by getting into the affected area through an opening in the skin, such as a cut or a hangnail. °RISK FACTORS °This condition is more likely to develop in: °· People who get their hands wet often, such as those who work as dishwashers, bartenders, or nurses. °· People who bite their fingernails or suck their thumbs. °· People who trim their nails too short. °· People who have hangnails or injured fingertips. °· People who get manicures. °· People who have diabetes. °SYMPTOMS °Symptoms of this condition include: °· Redness and swelling of the skin near the nail. °· Tenderness around the nail when you touch the area. °· Pus-filled bumps under the cuticle. The cuticle is the skin at the base or sides of the nail. °· Fluid or pus under the nail. °· Throbbing pain in the area. °DIAGNOSIS °This condition is usually diagnosed with a physical exam. In some cases, a sample of pus may be taken from an abscess to be tested in a lab. This can help to determine what type of bacteria or fungi is causing the condition. °TREATMENT °Treatment for this condition depends on the cause and severity of the condition. If the condition is mild, it may clear up on its own in a few days. Your health care provider may recommend soaking the affected area in warm water a few times a day. When treatment is needed, the options may  include: °· Antibiotic medicine, if the condition is caused by a bacterial infection. °· Antifungal medicine, if the condition is caused by a fungal infection. °· Incision and drainage, if an abscess is present. In this procedure, the health care provider will cut open the abscess so the pus can drain out. °HOME CARE INSTRUCTIONS °· Soak the affected area in warm water if directed to do so by your health care provider. You may be told to do this for 20 minutes, 2-3 times a day. Keep the area dry in between soakings. °· Take medicines only as directed by your health care provider. °· If you were prescribed an antibiotic medicine, finish all of it even if you start to feel better. °· Keep the affected area clean. °· Do not try to drain a fluid-filled bump yourself. °· If you will be washing dishes or performing other tasks that require your hands to get wet, wear rubber gloves. You should also wear gloves if your hands might come in contact with irritating substances, such as cleaners or chemicals. °· Follow your health care provider's instructions about: °¨ Wound care. °¨ Bandage (dressing) changes and removal. °SEEK MEDICAL CARE IF: °· Your symptoms get worse or do not improve with treatment. °· You have a fever or chills. °· You have redness spreading from the affected area. °· You have continued or increased fluid, blood, or pus coming from the affected area. °· Your finger or knuckle becomes swollen or is difficult to move. °  °  This information is not intended to replace advice given to you by your health care provider. Make sure you discuss any questions you have with your health care provider. °  °Document Released: 07/23/2000 Document Revised: 06/13/2014 Document Reviewed: 01/04/2014 °Elsevier Interactive Patient Education ©2016 [employer] ° °

## 2015-10-08 NOTE — Progress Notes (Signed)
   Subjective:    Patient ID: Pamela [name], female    DOB: 1961/01/06, 55 y.o.   MRN: [id number]  HPI Patient comes in today c/o sore place on left 2nd toe- yellowish drainage. Denies any injury.    Review of Systems  Constitutional: Negative.   HENT: Negative.   Respiratory: Negative.   Cardiovascular: Negative.   Genitourinary: Negative.   Neurological: Negative.   Psychiatric/Behavioral: Negative.   All other systems reviewed and are negative.      Objective:   Physical Exam  Constitutional: She is oriented to person, place, and time. She appears well-developed and well-nourished.  Cardiovascular: Normal rate, regular rhythm and normal heart sounds.   Pulmonary/Chest: Effort normal and breath sounds normal.  Neurological: She is alert and oriented to person, place, and time.  Skin: Skin is warm.  Left second nail is curled under on bil edges with erythematous moist  Open area at  Nail ed base.   BP 113/81   Pulse 75   Temp 97 F (36.1 C) (Oral)   Ht 5' (1.524 m)   Wt 179 lb (81.2 kg)   BMI 34.96 kg/m         Assessment & Plan:   1. Paronychia of second toe of left foot    Meds ordered this encounter  Medications  . ciprofloxacin (CIPRO) 500 MG tablet    Sig: Take 1 tablet (500 mg total) by mouth 2 (two) times daily.    Dispense:  20 tablet    Refill:  0    Order Specific Question:   Supervising Provider    Answer:   Eustaquio Maize [[id number]]   Soak toe in epsom salt BID Keep clean and dry May need to remove nail to see if will grow straighter RTO prn  Mary-Margaret Hassell Done, FNP

## 2015-11-16 ENCOUNTER — Other Ambulatory Visit: Payer: Self-pay

## 2015-11-16 DIAGNOSIS — K219 Gastro-esophageal reflux disease without esophagitis: Secondary | ICD-10-CM

## 2015-11-19 MED ORDER — PANTOPRAZOLE SODIUM 40 MG PO TBEC: 40.0000 mg | DELAYED_RELEASE_TABLET | Freq: Every day | ORAL | 11 refills | Status: DC

## 2015-12-18 ENCOUNTER — Other Ambulatory Visit: Payer: Self-pay

## 2015-12-18 DIAGNOSIS — K219 Gastro-esophageal reflux disease without esophagitis: Secondary | ICD-10-CM

## 2015-12-18 MED ORDER — PANTOPRAZOLE SODIUM 40 MG PO TBEC: 40.0000 mg | DELAYED_RELEASE_TABLET | Freq: Every day | ORAL | 3 refills | Status: DC

## 2016-01-22 ENCOUNTER — Encounter: Payer: Self-pay | Admitting: Gastroenterology

## 2016-03-07 ENCOUNTER — Ambulatory Visit: Payer: BLUE CROSS/BLUE SHIELD | Admitting: Nurse Practitioner

## 2016-03-18 ENCOUNTER — Ambulatory Visit (INDEPENDENT_AMBULATORY_CARE_PROVIDER_SITE_OTHER): Payer: BLUE CROSS/BLUE SHIELD | Admitting: Nurse Practitioner

## 2016-03-18 ENCOUNTER — Encounter: Payer: Self-pay | Admitting: Nurse Practitioner

## 2016-03-18 VITALS — BP 142/88 | Temp 97.0°F | Ht 60.0 in | Wt 167.6 lb

## 2016-03-18 DIAGNOSIS — E039 Hypothyroidism, unspecified: Secondary | ICD-10-CM | POA: Diagnosis not present

## 2016-03-18 DIAGNOSIS — E7800 Pure hypercholesterolemia, unspecified: Secondary | ICD-10-CM | POA: Diagnosis not present

## 2016-03-18 DIAGNOSIS — E559 Vitamin D deficiency, unspecified: Secondary | ICD-10-CM

## 2016-03-18 DIAGNOSIS — I10 Essential (primary) hypertension: Secondary | ICD-10-CM

## 2016-03-18 DIAGNOSIS — Z6832 Body mass index (BMI) 32.0-32.9, adult: Secondary | ICD-10-CM

## 2016-03-18 DIAGNOSIS — K219 Gastro-esophageal reflux disease without esophagitis: Secondary | ICD-10-CM

## 2016-03-18 DIAGNOSIS — E876 Hypokalemia: Secondary | ICD-10-CM

## 2016-03-18 MED ORDER — LEVOTHYROXINE SODIUM 25 MCG PO TABS: 25.0000 ug | ORAL_TABLET | Freq: Every day | ORAL | 1 refills | Status: DC

## 2016-03-18 MED ORDER — POTASSIUM CHLORIDE CRYS ER 20 MEQ PO TBCR: 20.0000 meq | EXTENDED_RELEASE_TABLET | Freq: Two times a day (BID) | ORAL | 3 refills | Status: DC

## 2016-03-18 NOTE — Patient Instructions (Signed)
Exercising to Stay Healthy Introduction Exercising regularly is important. It has many health benefits, such as:  Improving your overall fitness, flexibility, and endurance.  Increasing your bone density.  Helping with weight control.  Decreasing your body fat.  Increasing your muscle strength.  Reducing stress and tension.  Improving your overall health. In order to become healthy and stay healthy, it is recommended that you do moderate-intensity and vigorous-intensity exercise. You can tell that you are exercising at a moderate intensity if you have a higher heart rate and faster breathing, but you are still able to hold a conversation. You can tell that you are exercising at a vigorous intensity if you are breathing much harder and faster and cannot hold a conversation while exercising. How often should I exercise? Choose an activity that you enjoy and set realistic goals. Your health care provider can help you to make an activity plan that works for you. Exercise regularly as directed by your health care provider. This may include:  Doing resistance training twice each week, such as:  Push-ups.  Sit-ups.  Lifting weights.  Using resistance bands.  Doing a given intensity of exercise for a given amount of time. Choose from these options:  150 minutes of moderate-intensity exercise every week.  75 minutes of vigorous-intensity exercise every week.  A mix of moderate-intensity and vigorous-intensity exercise every week. Children, pregnant women, people who are out of shape, people who are overweight, and older adults may need to consult a health care provider for individual recommendations. If you have any sort of medical condition, be sure to consult your health care provider before starting a new exercise program. What are some exercise ideas? Some moderate-intensity exercise ideas include:  Walking at a rate of 1 mile in 15  minutes.  Biking.  Hiking.  Golfing.  Dancing. Some vigorous-intensity exercise ideas include:  Walking at a rate of at least 4.5 miles per hour.  Jogging or running at a rate of 5 miles per hour.  Biking at a rate of at least 10 miles per hour.  Lap swimming.  Roller-skating or in-line skating.  Cross-country skiing.  Vigorous competitive sports, such as football, basketball, and soccer.  Jumping rope.  Aerobic dancing. What are some everyday activities that can help me to get exercise?  Yard work, such as:  Psychologist, educational.  Raking and bagging leaves.  Washing and waxing your car.  Pushing a stroller.  Shoveling snow.  Gardening.  Washing windows or floors. How can I be more active in my day-to-day activities?  Use the stairs instead of the elevator.  Take a walk during your lunch break.  If you drive, park your car farther away from work or school.  If you take public transportation, get off one stop early and walk the rest of the way.  Make all of your phone calls while standing up and walking around.  Get up, stretch, and walk around every 30 minutes throughout the day. What guidelines should I follow while exercising?  Do not exercise so much that you hurt yourself, feel dizzy, or get very short of breath.  Consult your health care provider before starting a new exercise program.  Wear comfortable clothes and shoes with good support.  Drink plenty of water while you exercise to prevent dehydration or heat stroke. Body water is lost during exercise and must be replaced.  Work out until you breathe faster and your heart beats faster. This information is not intended to replace  advice given to you by your health care provider. Make sure you discuss any questions you have with your health care provider. Document Released: 03/01/2010 Document Revised: 07/05/2015 Document Reviewed: 06/30/2013  2017 Elsevier

## 2016-03-18 NOTE — Progress Notes (Signed)
Subjective:    Patient ID: Pamela [name], female    DOB: Feb 27, 1960, 56 y.o.   MRN: [id number]  Patient here today for follow up of chronic medical problems.  Outpatient Encounter Prescriptions as of 03/18/2016  Medication Sig  . ibuprofen (ADVIL,MOTRIN) 200 MG tablet Take 200 mg by mouth every 6 (six) hours as needed for pain. Reported on 05/12/2015  . levothyroxine (SYNTHROID, LEVOTHROID) 25 MCG tablet Take 1 tablet (25 mcg total) by mouth daily before breakfast.  . ranitidine (ZANTAC) 150 MG tablet Take 150 mg by mouth 2 (two) times daily.  . Vitamin D, Ergocalciferol, (DRISDOL) 50000 units CAPS capsule Take 1 capsule (50,000 Units total) by mouth every 7 (seven) days.      . potassium chloride SA (K-DUR,KLOR-CON) 20 MEQ tablet Take 1 tablet (20 mEq total) by mouth 2 (two) times daily. (Patient not taking: Reported on 03/18/2016)     Hypertension  This is a chronic problem. The current episode started more than 1 year ago. The problem has been resolved since onset. The problem is controlled. Pertinent negatives include no anxiety, headaches, palpitations, peripheral edema or shortness of breath. Risk factors for coronary artery disease include dyslipidemia, obesity, post-menopausal state, sedentary lifestyle and family history. Past treatments include nothing. The current treatment provides mild improvement. There is no history of kidney disease, CAD/MI, CVA or heart failure. There is no history of sleep apnea or a thyroid problem.  Hyperlipidemia  This is a chronic problem. The current episode started more than 1 year ago. The problem is uncontrolled. Recent lipid tests were reviewed and are high. Exacerbating diseases include obesity. Pertinent negatives include no leg pain or shortness of breath. Current antihyperlipidemic treatment includes statins. The current treatment provides moderate improvement of lipids. Risk factors for coronary artery disease include dyslipidemia, hypertension, obesity,  post-menopausal and family history.  Thyroid Problem  Presents for follow-up visit. Patient reports no anxiety, depressed mood, dry skin, hoarse voice or palpitations. The symptoms have been stable. Past treatments include levothyroxine. The treatment provided significant relief. Her past medical history is significant for hyperlipidemia. There is no history of heart failure.      Review of Systems  Constitutional: Negative.   HENT: Negative.  Negative for hoarse voice.   Eyes: Negative.   Respiratory: Negative.  Negative for shortness of breath.   Cardiovascular: Negative.  Negative for palpitations.  Gastrointestinal: Negative.   Endocrine: Negative.   Genitourinary: Negative.   Musculoskeletal: Negative.   Neurological: Negative.  Negative for headaches.  Hematological: Negative.   Psychiatric/Behavioral: Negative.  The patient is not nervous/anxious.   All other systems reviewed and are negative.      Objective:   Physical Exam  Constitutional: She is oriented to person, place, and time. She appears well-developed and well-nourished. No distress.  HENT:  Head: Normocephalic and atraumatic.  Right Ear: External ear normal.  Left Ear: External ear normal.  Mouth/Throat: Oropharynx is clear and moist.     Eyes: Pupils are equal, round, and reactive to [name].  Neck: Normal range of motion. Neck supple. No thyromegaly present.  Cardiovascular: Normal rate, regular rhythm, normal heart sounds and intact distal pulses.   No murmur heard. Pulmonary/Chest: Effort normal and breath sounds normal. No respiratory distress. She has no wheezes.  Abdominal: Soft. Bowel sounds are normal. She exhibits no distension. There is no tenderness.  Musculoskeletal: Normal range of motion. She exhibits no edema or tenderness.  Neurological: She is alert and oriented to person, place, and  time. She has normal reflexes. No cranial nerve deficit.  Skin: Skin is warm and dry.  Psychiatric: She has  a normal mood and affect. Her behavior is normal. Judgment and thought content normal.  Vitals reviewed.   BP (!) 142/88 (BP Location: Left Arm, Cuff Size: Normal)   Temp 97 F (36.1 C) (Oral)   Ht 5' (1.524 m)   Wt 167 lb 9.6 oz (76 kg)   BMI 32.73 kg/m      Assessment & Plan:  1. Essential hypertension Low sodium diet - CMP14+EGFR  2. Gastroesophageal reflux disease, esophagitis presence not specified Avoid spicy foods Do not eat 2 hours prior to bedtime  3. Hypothyroidism, unspecified type - levothyroxine (SYNTHROID, LEVOTHROID) 25 MCG tablet; Take 1 tablet (25 mcg total) by mouth daily before breakfast.  Dispense: 90 tablet; Refill: 1 - Thyroid Panel With TSH  4. BMI 32.0-32.9,adult Discussed diet and exercise for person with BMI >25 Will recheck weight in 3-6 months  5. Pure hypercholesterolemia Low fat diet - Lipid panel  6. Vitamin D deficiency - VITAMIN D 25 Hydroxy (Vit-D Deficiency, Fractures)  7. Hypokalemia - potassium chloride SA (K-DUR,KLOR-CON) 20 MEQ tablet; Take 1 tablet (20 mEq total) by mouth 2 (two) times daily.  Dispense: 30 tablet; Refill: 3    Labs pending Health maintenance reviewed Diet and exercise encouraged Continue all meds Follow up  In 6 months   Joanna, FNP

## 2016-03-19 LAB — VITAMIN D 25 HYDROXY (VIT D DEFICIENCY, FRACTURES): Vit D, 25-Hydroxy: 44.3 ng/mL (ref 30.0–100.0)

## 2016-03-19 LAB — CMP14+EGFR
ALT: 25 IU/L (ref 0–32)
AST: 22 IU/L (ref 0–40)
Albumin/Globulin Ratio: 1.7 (ref 1.2–2.2)
Albumin: 4.2 g/dL (ref 3.5–5.5)
Alkaline Phosphatase: 80 IU/L (ref 39–117)
BUN/Creatinine Ratio: 15 (ref 9–23)
BUN: 11 mg/dL (ref 6–24)
Bilirubin Total: 0.5 mg/dL (ref 0.0–1.2)
CO2: 23 mmol/L (ref 18–29)
Calcium: 9.2 mg/dL (ref 8.7–10.2)
Chloride: 101 mmol/L (ref 96–106)
Creatinine, Ser: 0.73 mg/dL (ref 0.57–1.00)
GFR calc Af Amer: 107 mL/min/{1.73_m2} (ref >59)
GFR calc non Af Amer: 93 mL/min/{1.73_m2} (ref >59)
Globulin, Total: 2.5 g/dL (ref 1.5–4.5)
Glucose: 88 mg/dL (ref 65–99)
Potassium: 3.5 mmol/L (ref 3.5–5.2)
Sodium: 141 mmol/L (ref 134–144)
Total Protein: 6.7 g/dL (ref 6.0–8.5)

## 2016-03-19 LAB — LIPID PANEL
Chol/HDL Ratio: 3.4 ratio units (ref 0.0–4.4)
Cholesterol, Total: 194 mg/dL (ref 100–199)
HDL: 57 mg/dL (ref >39)
LDL Calculated: 122 mg/dL — ABNORMAL HIGH (ref 0–99)
Triglycerides: 76 mg/dL (ref 0–149)
VLDL Cholesterol Cal: 15 mg/dL (ref 5–40)

## 2016-03-19 LAB — THYROID PANEL WITH TSH
Free Thyroxine Index: 2 (ref 1.2–4.9)
T3 Uptake Ratio: 25 % (ref 24–39)
T4, Total: 8.1 ug/dL (ref 4.5–12.0)
TSH: 1.31 u[IU]/mL (ref 0.450–4.500)

## 2016-03-24 ENCOUNTER — Telehealth: Payer: Self-pay | Admitting: Nurse Practitioner

## 2016-03-24 NOTE — Telephone Encounter (Signed)
Patient aware to continue taking potassium supplement

## 2016-04-23 ENCOUNTER — Other Ambulatory Visit: Payer: Self-pay

## 2016-04-23 DIAGNOSIS — E876 Hypokalemia: Secondary | ICD-10-CM

## 2016-04-23 MED ORDER — POTASSIUM CHLORIDE CRYS ER 20 MEQ PO TBCR: 20.0000 meq | EXTENDED_RELEASE_TABLET | Freq: Two times a day (BID) | ORAL | 0 refills | Status: DC

## 2016-09-16 ENCOUNTER — Ambulatory Visit (INDEPENDENT_AMBULATORY_CARE_PROVIDER_SITE_OTHER): Payer: BLUE CROSS/BLUE SHIELD | Admitting: Nurse Practitioner

## 2016-09-16 ENCOUNTER — Encounter: Payer: Self-pay | Admitting: Nurse Practitioner

## 2016-09-16 VITALS — BP 134/86 | HR 66 | Temp 97.7°F | Ht 60.0 in | Wt 165.0 lb

## 2016-09-16 DIAGNOSIS — Z0000 Encounter for general adult medical examination without abnormal findings: Secondary | ICD-10-CM

## 2016-09-16 DIAGNOSIS — E7800 Pure hypercholesterolemia, unspecified: Secondary | ICD-10-CM | POA: Diagnosis not present

## 2016-09-16 DIAGNOSIS — K219 Gastro-esophageal reflux disease without esophagitis: Secondary | ICD-10-CM

## 2016-09-16 DIAGNOSIS — E039 Hypothyroidism, unspecified: Secondary | ICD-10-CM

## 2016-09-16 DIAGNOSIS — Z6832 Body mass index (BMI) 32.0-32.9, adult: Secondary | ICD-10-CM

## 2016-09-16 DIAGNOSIS — Z01419 Encounter for gynecological examination (general) (routine) without abnormal findings: Secondary | ICD-10-CM | POA: Diagnosis not present

## 2016-09-16 DIAGNOSIS — I10 Essential (primary) hypertension: Secondary | ICD-10-CM | POA: Diagnosis not present

## 2016-09-16 DIAGNOSIS — E559 Vitamin D deficiency, unspecified: Secondary | ICD-10-CM

## 2016-09-16 MED ORDER — RANITIDINE HCL 150 MG PO TABS: 150.0000 mg | ORAL_TABLET | Freq: Two times a day (BID) | ORAL | 5 refills | Status: DC

## 2016-09-16 MED ORDER — LEVOTHYROXINE SODIUM 25 MCG PO TABS: 25.0000 ug | ORAL_TABLET | Freq: Every day | ORAL | 1 refills | Status: DC

## 2016-09-16 NOTE — Progress Notes (Signed)
Subjective:    Patient ID: Pamela [name], female    DOB: 09-20-1960, 56 y.o.   MRN: [id number]  HPI   Robbyn Hodkinson is here today for follow up of chronic medical problem.  Outpatient Encounter Prescriptions as of 09/16/2016  Medication Sig  . ibuprofen (ADVIL,MOTRIN) 200 MG tablet Take 200 mg by mouth every 6 (six) hours as needed for pain. Reported on 05/12/2015  . levothyroxine (SYNTHROID, LEVOTHROID) 25 MCG tablet Take 1 tablet (25 mcg total) by mouth daily before breakfast.  . Multiple Vitamin (MULTIVITAMIN) tablet Take 1 tablet by mouth daily.  . ranitidine (ZANTAC) 150 MG tablet Take 150 mg by mouth 2 (two) times daily.  . Vitamin D, Ergocalciferol, (DRISDOL) 50000 units CAPS capsule Take 1 capsule (50,000 Units total) by mouth every 7 (seven) days.     1. Essential hypertension  No c/o chest pain, SOB and headache. Does not check blood pressure at home.  2. Gastroesophageal reflux disease, esophagitis presence not specified  Takes zantac daily with good symptom control  3. Hypothyroidism, unspecified type  No problems  4. Vitamin D deficiency  Vitamin d OTC  5. Pure hypercholesterolemia  Tries to avoid fried foods  6. BMI 32.0-32.9,adult  Weight down 3 lbs     New complaints: None today  Social history:    Review of Systems  Constitutional: Negative for activity change and appetite change.  HENT: Negative.   Eyes: Negative for pain.  Respiratory: Negative for shortness of breath.   Cardiovascular: Negative for chest pain, palpitations and leg swelling.  Gastrointestinal: Negative for abdominal pain.  Endocrine: Negative for polydipsia.  Genitourinary: Negative.   Skin: Negative for rash.  Neurological: Negative for dizziness, weakness and headaches.  Hematological: Does not bruise/bleed easily.  Psychiatric/Behavioral: Negative.   All other systems reviewed and are negative.      Objective:   Physical Exam  Constitutional: She is oriented to person, place,  and time. She appears well-developed and well-nourished.  HENT:  Head: Normocephalic.  Right Ear: Hearing, tympanic membrane, external ear and ear canal normal.  Left Ear: Hearing, tympanic membrane, external ear and ear canal normal.  Nose: Nose normal.  Mouth/Throat: Uvula is midline and oropharynx is clear and moist.  Eyes: Pupils are equal, round, and reactive to [name]. Conjunctivae and EOM are normal.  Neck: Normal range of motion and full passive range of motion without pain. Neck supple. No JVD present. Carotid bruit is not present. No thyroid mass and no thyromegaly present.  Cardiovascular: Normal rate, normal heart sounds and intact distal pulses.   No murmur heard. Pulmonary/Chest: Effort normal and breath sounds normal. Right breast exhibits no inverted nipple, no mass, no nipple discharge, no skin change and no tenderness. Left breast exhibits no inverted nipple, no mass, no nipple discharge, no skin change and no tenderness.  Abdominal: Soft. Bowel sounds are normal. She exhibits no mass. There is no tenderness.  Genitourinary: Vagina normal and uterus normal. No breast swelling, tenderness, discharge or bleeding. No vaginal discharge found.  Genitourinary Comments: bimanual exam-No adnexal masses or tenderness. Cervix parous and pink  Musculoskeletal: Normal range of motion.  Lymphadenopathy:    She has no cervical adenopathy.  Neurological: She is alert and oriented to person, place, and time.  Skin: Skin is warm and dry.  Psychiatric: She has a normal mood and affect. Her behavior is normal. Judgment and thought content normal.    BP 134/86   Pulse 66   Temp 97.7 F (36.5  C) (Oral)   Ht 5' (1.524 m)   Wt 165 lb (74.8 kg)   BMI 32.22 kg/m        Assessment & Plan:  1. Annual physical exam - CBC with Differential/Platelet  2. Gynecologic exam normal - IGP, Aptima HPV, rfx 16/18,45  3. Essential hypertension Low sodium diet - CMP14+EGFR  4.  Gastroesophageal reflux disease, esophagitis presence not specified Avoid spicy foods Do not eat 2 hours prior to bedtime - ranitidine (ZANTAC) 150 MG tablet; Take 1 tablet (150 mg total) by mouth 2 (two) times daily.  Dispense: 60 tablet; Refill: 5  5. Hypothyroidism, unspecified type - Thyroid Panel With TSH - levothyroxine (SYNTHROID, LEVOTHROID) 25 MCG tablet; Take 1 tablet (25 mcg total) by mouth daily before breakfast.  Dispense: 90 tablet; Refill: 1  6. Vitamin D deficiency  7. Pure hypercholesterolemia Low fat diet - Lipid panel  8. BMI 32.0-32.9,adult Discussed diet and exercise for person with BMI >25 Will recheck weight in 3-6 months     Labs pending Health maintenance reviewed Diet and exercise encouraged Continue all meds Follow up  In 6 months   [place], FNP

## 2016-09-16 NOTE — Addendum Note (Signed)
Addended by: Chevis Pretty on: 09/16/2016 03:51 PM   Modules accepted: Level of Service

## 2016-09-16 NOTE — Patient Instructions (Signed)

## 2016-09-17 LAB — CMP14+EGFR
ALT: 25 IU/L (ref 0–32)
AST: 27 IU/L (ref 0–40)
Albumin/Globulin Ratio: 1.6 (ref 1.2–2.2)
Albumin: 4.1 g/dL (ref 3.5–5.5)
Alkaline Phosphatase: 84 IU/L (ref 39–117)
BUN/Creatinine Ratio: 17 (ref 9–23)
BUN: 13 mg/dL (ref 6–24)
Bilirubin Total: 0.2 mg/dL (ref 0.0–1.2)
CO2: 22 mmol/L (ref 20–29)
Calcium: 9 mg/dL (ref 8.7–10.2)
Chloride: 108 mmol/L — ABNORMAL HIGH (ref 96–106)
Creatinine, Ser: 0.78 mg/dL (ref 0.57–1.00)
GFR calc Af Amer: 98 mL/min/{1.73_m2} (ref >59)
GFR calc non Af Amer: 85 mL/min/{1.73_m2} (ref >59)
Globulin, Total: 2.5 g/dL (ref 1.5–4.5)
Glucose: 89 mg/dL (ref 65–99)
Potassium: 3.6 mmol/L (ref 3.5–5.2)
Sodium: 145 mmol/L — ABNORMAL HIGH (ref 134–144)
Total Protein: 6.6 g/dL (ref 6.0–8.5)

## 2016-09-17 LAB — CBC WITH DIFFERENTIAL/PLATELET
Basophils Absolute: 0 10*3/uL (ref 0.0–0.2)
Basos: 1 %
EOS (ABSOLUTE): 0.1 10*3/uL (ref 0.0–0.4)
Eos: 2 %
Hematocrit: 39.3 % (ref 34.0–46.6)
Hemoglobin: 12.7 g/dL (ref 11.1–15.9)
Immature Grans (Abs): 0 10*3/uL (ref 0.0–0.1)
Immature Granulocytes: 0 %
Lymphocytes Absolute: 1.3 10*3/uL (ref 0.7–3.1)
Lymphs: 32 %
MCH: 29.4 pg (ref 26.6–33.0)
MCHC: 32.3 g/dL (ref 31.5–35.7)
MCV: 91 fL (ref 79–97)
Monocytes Absolute: 0.3 10*3/uL (ref 0.1–0.9)
Monocytes: 8 %
Neutrophils Absolute: 2.3 10*3/uL (ref 1.4–7.0)
Neutrophils: 57 %
Platelets: 201 10*3/uL (ref 150–379)
RBC: 4.32 x10E6/uL (ref 3.77–5.28)
RDW: 13.7 % (ref 12.3–15.4)
WBC: 4 10*3/uL (ref 3.4–10.8)

## 2016-09-17 LAB — LIPID PANEL
Chol/HDL Ratio: 3 ratio (ref 0.0–4.4)
Cholesterol, Total: 188 mg/dL (ref 100–199)
HDL: 63 mg/dL (ref >39)
LDL Calculated: 111 mg/dL — ABNORMAL HIGH (ref 0–99)
Triglycerides: 71 mg/dL (ref 0–149)
VLDL Cholesterol Cal: 14 mg/dL (ref 5–40)

## 2016-09-17 LAB — THYROID PANEL WITH TSH
Free Thyroxine Index: 2 (ref 1.2–4.9)
T3 Uptake Ratio: 26 % (ref 24–39)
T4, Total: 7.7 ug/dL (ref 4.5–12.0)
TSH: 3.33 u[IU]/mL (ref 0.450–4.500)

## 2016-09-18 ENCOUNTER — Other Ambulatory Visit: Payer: Self-pay | Admitting: Nurse Practitioner

## 2016-09-18 DIAGNOSIS — E039 Hypothyroidism, unspecified: Secondary | ICD-10-CM

## 2016-09-18 LAB — IGP, APTIMA HPV, RFX 16/18,45
HPV Aptima: NEGATIVE
PAP Smear Comment: 0

## 2016-10-02 ENCOUNTER — Other Ambulatory Visit: Payer: Self-pay | Admitting: Nurse Practitioner

## 2016-10-02 DIAGNOSIS — E559 Vitamin D deficiency, unspecified: Secondary | ICD-10-CM

## 2016-11-07 DIAGNOSIS — H353131 Nonexudative age-related macular degeneration, bilateral, early dry stage: Secondary | ICD-10-CM | POA: Diagnosis not present

## 2017-02-16 ENCOUNTER — Encounter: Payer: Self-pay | Admitting: Nurse Practitioner

## 2017-02-16 DIAGNOSIS — Z1231 Encounter for screening mammogram for malignant neoplasm of breast: Secondary | ICD-10-CM | POA: Diagnosis not present

## 2017-03-19 ENCOUNTER — Ambulatory Visit: Payer: BLUE CROSS/BLUE SHIELD | Admitting: Nurse Practitioner

## 2017-06-30 DIAGNOSIS — L600 Ingrowing nail: Secondary | ICD-10-CM | POA: Diagnosis not present

## 2017-06-30 DIAGNOSIS — L03031 Cellulitis of right toe: Secondary | ICD-10-CM | POA: Diagnosis not present

## 2017-07-14 DIAGNOSIS — L03031 Cellulitis of right toe: Secondary | ICD-10-CM | POA: Diagnosis not present

## 2017-07-24 ENCOUNTER — Other Ambulatory Visit: Payer: Self-pay | Admitting: Nurse Practitioner

## 2017-07-24 DIAGNOSIS — E039 Hypothyroidism, unspecified: Secondary | ICD-10-CM

## 2017-08-04 DIAGNOSIS — L03032 Cellulitis of left toe: Secondary | ICD-10-CM | POA: Diagnosis not present

## 2017-10-15 ENCOUNTER — Other Ambulatory Visit: Payer: Self-pay | Admitting: Nurse Practitioner

## 2017-10-15 DIAGNOSIS — E559 Vitamin D deficiency, unspecified: Secondary | ICD-10-CM

## 2017-10-21 ENCOUNTER — Other Ambulatory Visit: Payer: Self-pay | Admitting: Nurse Practitioner

## 2017-10-21 DIAGNOSIS — E039 Hypothyroidism, unspecified: Secondary | ICD-10-CM

## 2017-11-17 ENCOUNTER — Other Ambulatory Visit: Payer: Self-pay | Admitting: Nurse Practitioner

## 2017-11-17 DIAGNOSIS — E039 Hypothyroidism, unspecified: Secondary | ICD-10-CM

## 2017-11-18 MED ORDER — LEVOTHYROXINE SODIUM 25 MCG PO TABS: 25.0000 ug | ORAL_TABLET | Freq: Every day | ORAL | 0 refills | Status: DC

## 2017-11-18 NOTE — Telephone Encounter (Signed)
MMM. NTBS. 30 days given 10/21/17. Last OV & TSH 09/16/16

## 2017-11-18 NOTE — Addendum Note (Signed)
Addended by: Chevis Pretty on: 11/18/2017 04:37 PM   Modules accepted: Orders

## 2017-11-18 NOTE — Telephone Encounter (Signed)
Patient states she does not have insurance at this time.

## 2017-12-15 ENCOUNTER — Other Ambulatory Visit: Payer: Self-pay | Admitting: Nurse Practitioner

## 2017-12-15 DIAGNOSIS — E039 Hypothyroidism, unspecified: Secondary | ICD-10-CM

## 2017-12-16 NOTE — Telephone Encounter (Signed)
MMM. NTBS 30 days given 11/18/17

## 2017-12-17 MED ORDER — LEVOTHYROXINE SODIUM 25 MCG PO TABS: 25.0000 ug | ORAL_TABLET | Freq: Every day | ORAL | 1 refills | Status: DC

## 2017-12-17 NOTE — Addendum Note (Signed)
Addended by: Zannie Cove on: 12/17/2017 08:49 AM   Modules accepted: Orders

## 2018-02-16 ENCOUNTER — Encounter: Payer: Self-pay | Admitting: Family Medicine

## 2018-02-16 ENCOUNTER — Ambulatory Visit: Payer: BLUE CROSS/BLUE SHIELD | Admitting: Family Medicine

## 2018-02-16 VITALS — BP 130/88 | HR 101 | Temp 99.1°F | Ht 60.0 in | Wt 177.0 lb

## 2018-02-16 DIAGNOSIS — J0100 Acute maxillary sinusitis, unspecified: Secondary | ICD-10-CM

## 2018-02-16 MED ORDER — PSEUDOEPHEDRINE-GUAIFENESIN ER 120-1200 MG PO TB12: 1.0000 | ORAL_TABLET | Freq: Two times a day (BID) | ORAL | 0 refills | Status: DC

## 2018-02-16 MED ORDER — AMOXICILLIN-POT CLAVULANATE 875-125 MG PO TABS: 1.0000 | ORAL_TABLET | Freq: Two times a day (BID) | ORAL | 0 refills | Status: DC

## 2018-02-16 NOTE — Progress Notes (Signed)
Chief Complaint  Patient presents with  . Sinusitis    HPI  Patient presents today for Symptoms include congestion, facial pain, nasal congestion, non productive cough, post nasal drip and sinus pressure. There is no fever, chills, or sweats. Onset of symptoms was a few days ago, gradually worsening since that time.    PMH: Smoking status noted ROS: Per HPI  Objective: BP 130/88   Pulse (!) 101   Temp 99.1 F (37.3 C) (Oral)   Ht 5' (1.524 m)   Wt 177 lb (80.3 kg)   BMI 34.57 kg/m  Gen: NAD, alert, cooperative with exam HEENT: NCAT, EOMI, PERRL. Max tenderness bilaterally CV: RRR, good S1/S2, no murmur Resp: CTABL, no wheezes, non-labored Ext: No edema, warm Neuro: Alert and oriented, No gross deficits  Assessment and plan:  1. Acute maxillary sinusitis, recurrence not specified     Meds ordered this encounter  Medications  . amoxicillin-clavulanate (AUGMENTIN) 875-125 MG tablet    Sig: Take 1 tablet by mouth 2 (two) times daily. Take all of this medication    Dispense:  20 tablet    Refill:  0  . Pseudoephedrine-Guaifenesin [id number] MG TB12    Sig: Take 1 tablet by mouth 2 (two) times daily. For congestion    Dispense:  20 each    Refill:  0    No orders of the defined types were placed in this encounter.   Follow up as needed.  Claretta Fraise, MD

## 2018-02-19 ENCOUNTER — Telehealth: Payer: Self-pay | Admitting: Nurse Practitioner

## 2018-02-19 NOTE — Telephone Encounter (Signed)
Patient wants extended work note to go back Monday. Please advise.

## 2018-02-22 NOTE — Telephone Encounter (Signed)
Left message that extended work note is ready for pick up and to call back with questions or concerns.

## 2018-02-22 NOTE — Telephone Encounter (Signed)
Okay WS

## 2018-06-22 ENCOUNTER — Other Ambulatory Visit: Payer: Self-pay | Admitting: Nurse Practitioner

## 2018-06-22 DIAGNOSIS — E559 Vitamin D deficiency, unspecified: Secondary | ICD-10-CM

## 2018-10-04 ENCOUNTER — Other Ambulatory Visit: Payer: Self-pay

## 2018-10-04 DIAGNOSIS — Z20822 Contact with and (suspected) exposure to covid-19: Secondary | ICD-10-CM

## 2018-10-05 LAB — NOVEL CORONAVIRUS, NAA: SARS-CoV-2, NAA: NOT DETECTED

## 2018-11-25 ENCOUNTER — Other Ambulatory Visit: Payer: Self-pay | Admitting: Nurse Practitioner

## 2018-11-25 DIAGNOSIS — E039 Hypothyroidism, unspecified: Secondary | ICD-10-CM

## 2018-11-25 NOTE — Telephone Encounter (Signed)
MMM NTBS Last OV & TSH 09/16/16

## 2018-11-25 NOTE — Telephone Encounter (Signed)
Message left for patient.  Must schedule to be seen by provider for refills.

## 2018-12-31 ENCOUNTER — Ambulatory Visit (INDEPENDENT_AMBULATORY_CARE_PROVIDER_SITE_OTHER): Payer: Self-pay | Admitting: Family Medicine

## 2018-12-31 ENCOUNTER — Encounter: Payer: Self-pay | Admitting: Family Medicine

## 2018-12-31 DIAGNOSIS — M545 Low back pain, unspecified: Secondary | ICD-10-CM

## 2018-12-31 MED ORDER — METHOCARBAMOL 500 MG PO TABS: 500.0000 mg | ORAL_TABLET | Freq: Three times a day (TID) | ORAL | 1 refills | Status: DC | PRN

## 2018-12-31 NOTE — Progress Notes (Signed)
   Virtual Visit via Telephone Note  I connected with Pamela [name] on 12/31/18 at 4:02 PM by telephone and verified that I am speaking with the correct person using two identifiers. Pamela [name] is currently located in the car and nobody is currently with her during this visit. The provider, Loman Brooklyn, FNP is located in their office at time of visit.  I discussed the limitations, risks, security and privacy concerns of performing an evaluation and management service by telephone and the availability of in person appointments. I also discussed with the patient that there may be a patient responsible charge related to this service. The patient expressed understanding and agreed to proceed.  Subjective: PCP: Chevis Pretty, FNP  Chief Complaint  Patient presents with  . Back Pain   Back Pain: Patient presents for presents evaluation of low back problems.  Patient reports she has always had some back trouble and has had success going to the chiropractor in the past.  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  Recently she reports they keep pulling her to other locations where she has to do a lot of bending and jacking of the pallets.  This has caused a flare of her back pain.  She reports her back feels all knotted up.     ROS: Per HPI  Current Outpatient Medications:  .  levothyroxine (SYNTHROID, LEVOTHROID) 25 MCG tablet, Take 1 tablet (25 mcg total) by mouth daily before breakfast. (Needs to be seen), Disp: 90 tablet, Rfl: 1  Allergies  Allergen Reactions  . Iodine Hives   Past Medical History:  Diagnosis Date  . GERD (gastroesophageal reflux disease)    heartburn  . Headache(784.0)    migraines  . Hyperlipidemia   . Ovarian cyst   . Thyroid disease     Observations/Objective: A&O  No respiratory distress or wheezing audible over the phone Mood, judgement, and thought processes all WNL   Assessment and Plan: 1. Acute midline low back pain without sciatica - Encouraged use of a heating pad several times a day, muscle rubs such as Biofreeze several times a day, Tylenol/Motrin, and as needed use of Robaxin.  Discussed back ergonomics and provided education on preventing back injuries.  Encouraged to follow-up with her PCP regarding this issue. - methocarbamol (ROBAXIN) 500 MG tablet; Take 1-2 tablets (500-1,000 mg total) by mouth every 8 (eight) hours as needed for muscle spasms.  Dispense: 30 tablet; Refill: 1   Follow Up Instructions:  I discussed the assessment and treatment plan with the patient. The patient was provided an opportunity to ask questions and all were answered. The patient agreed with the plan and demonstrated an understanding of the instructions.   The patient was advised to call back or seek an in-person evaluation if the symptoms worsen or if the condition fails to improve as anticipated.  The above assessment and management plan was discussed with the patient. The patient verbalized understanding of and has agreed to the management plan. Patient is aware to call the clinic if symptoms persist or worsen. Patient is aware when to return to the clinic for a follow-up visit. Patient educated on when it is appropriate to go to the emergency department.   Time call ended: 4:09 PM  I provided 9 minutes of non-face-to-face time during this encounter.  Hendricks Limes, MSN, APRN, FNP-C [facility] 12/31/18

## 2018-12-31 NOTE — Patient Instructions (Signed)
Back Injury Prevention Back injuries can be very painful. They can also be difficult to heal. After having one back injury, you are more likely to have another one again. It is important to learn how to avoid injuring or re-injuring your back. The following tips can help you to prevent a back injury. What actions can I take to prevent back injuries? Changes in your diet Talk with your doctor about what to eat. Some foods can make the bones strong.  Talk with your doctor about how much calcium and vitamin D you need each day. These nutrients help to prevent weakening of the bones (osteoporosis).  Eat foods that have calcium. These include: ? Dairy products. ? Green leafy vegetables. ? Food and drinks that have had calcium added to them (fortified).  Eat foods that have vitamin D. These include: ? Milk. ? Food and drinks that have had vitamin D added to them.  Take other supplements and vitamins only as told by your doctor. Physical fitness Physical fitness makes your bones and muscles strong. It also improves your balance and strength.  Exercise for 30 minutes per day on most days of the week, or as told by your doctor. Make sure to: ? Do aerobic exercises, such as walking, jogging, biking, or swimming. ? Do exercises that increase balance and strength, such as tai chi and yoga. ? Do stretching exercises. This helps with flexibility. ? Develop strong belly (abdominal) muscles. Your belly muscles help to support your back.  Stay at a healthy weight. This lowers your risk of a back injury. Good posture        Prevent back injuries by developing and maintaining a good posture. To do this:  Sit up straight and stand up straight. Avoid leaning forward when you sit or hunching over when you stand.  Choose chairs that have good low-back (lumbar) support.  If you work at a desk: ? Sit close to it so you do not need to lean over. ? Keep your chin tucked in. ? Keep your neck drawn  back. ? Keep your elbows bent so that your arms make a corner (right angle).  When you drive: ? Sit high and close to the steering wheel. Add a low-back support to your car seat, if needed. ? Take breaks every hour if you are driving for long periods of time.  Avoid sitting or standing in one position for very long. Take breaks to get up, stretch, and walk around at least once every hour.  Sleep on your side with your knees slightly bent, or sleep on your back with a pillow under your knees.  Lifting, twisting, and reaching   Heavy lifting ? Avoid heavy lifting, especially lifting over and over again. If you must do heavy lifting: ? Stretch before lifting. ? Work slowly. ? Rest between lifts. ? Use a tool such as a cart or a dolly to move objects if one is available. ? Make several small trips instead of carrying one heavy load. ? Ask for help when you need it, especially when moving big objects. ? Follow these steps when lifting: ? Stand with your feet shoulder-width apart. ? Get as close to the object as you can. Do not pick up a heavy object that is far from your body. ? Use handles or lifting straps if they are available. ? Bend at your knees. Squat down, but keep your heels off the floor. ? Keep your shoulders back. Keep your chin tucked in. Keep  your back straight. ? Lift the object slowly while you tighten the muscles in your legs, belly, and bottom. Keep the object as close to the center of your body as possible. ? Follow these steps when putting down a heavy load: ? Stand with your feet shoulder-width apart. ? Lower the object slowly while you tighten the muscles in your legs, belly, and bottom. Keep the object as close to the center of your body as possible. ? Keep your shoulders back. Keep your chin tucked in. Keep your back straight. ? Bend at your knees. Squat down, but keep your heels off the floor. ? Use handles or lifting straps if they are available.  Twisting and  reaching ? Avoid lifting heavy objects above your waist. ? Do not twist at your waist while you are lifting or carrying a load. If you need to turn, move your feet. ? Do not bend over without bending at your knees. ? Avoid reaching over your head, across a table, or for an object on a high surface. Other things to do   Avoid wet floors and icy ground. Keep sidewalks clear of ice to prevent falls.  Do not sleep on a mattress that is too soft or too hard.  Store heavier objects on shelves at waist level.  Store lighter objects on lower or higher shelves.  Find ways to lower your stress, such as: ? Exercise. ? Massage. ? Relaxation techniques.  Talk with your doctor if you feel anxious or depressed. These conditions can make back pain worse.  Wear flat heel shoes with cushioned soles.  Use both shoulder straps when carrying a backpack.  Do not use any products that contain nicotine or tobacco, such as cigarettes and e-cigarettes. If you need help quitting, ask your doctor. Summary  Back injuries can be very painful and difficult to heal.  You can keep your back healthy by making certain changes. These include eating foods that make bones strong, working on being physically fit, developing a good posture, and lifting heavy objects in a safe way. This information is not intended to replace advice given to you by your health care provider. Make sure you discuss any questions you have with your health care provider. Document Released: 07/16/2007 Document Revised: 03/20/2017 Document Reviewed: 03/20/2017 Elsevier Patient Education  2020 [employer].

## 2019-02-21 ENCOUNTER — Other Ambulatory Visit: Payer: Self-pay

## 2019-02-22 ENCOUNTER — Encounter: Payer: Self-pay | Admitting: Nurse Practitioner

## 2019-02-22 ENCOUNTER — Ambulatory Visit (INDEPENDENT_AMBULATORY_CARE_PROVIDER_SITE_OTHER): Payer: BC Managed Care – PPO | Admitting: Nurse Practitioner

## 2019-02-22 VITALS — BP 136/82 | HR 71 | Temp 97.5°F | Resp 20 | Ht 60.0 in | Wt 174.0 lb

## 2019-02-22 DIAGNOSIS — Z6833 Body mass index (BMI) 33.0-33.9, adult: Secondary | ICD-10-CM

## 2019-02-22 DIAGNOSIS — K219 Gastro-esophageal reflux disease without esophagitis: Secondary | ICD-10-CM

## 2019-02-22 DIAGNOSIS — E039 Hypothyroidism, unspecified: Secondary | ICD-10-CM

## 2019-02-22 DIAGNOSIS — E7800 Pure hypercholesterolemia, unspecified: Secondary | ICD-10-CM

## 2019-02-22 DIAGNOSIS — I10 Essential (primary) hypertension: Secondary | ICD-10-CM

## 2019-02-22 MED ORDER — LEVOTHYROXINE SODIUM 25 MCG PO TABS: 25.0000 ug | ORAL_TABLET | Freq: Every day | ORAL | 1 refills | Status: DC

## 2019-02-22 NOTE — Patient Instructions (Signed)
Exercising to Stay Healthy To become healthy and stay healthy, it is recommended that you do moderate-intensity and vigorous-intensity exercise. You can tell that you are exercising at a moderate intensity if your heart starts beating faster and you start breathing faster but can still hold a conversation. You can tell that you are exercising at a vigorous intensity if you are breathing much harder and faster and cannot hold a conversation while exercising. Exercising regularly is important. It has many health benefits, such as:  Improving overall fitness, flexibility, and endurance.  Increasing bone density.  Helping with weight control.  Decreasing body fat.  Increasing muscle strength.  Reducing stress and tension.  Improving overall health. How often should I exercise? Choose an activity that you enjoy, and set realistic goals. Your health care provider can help you make an activity plan that works for you. Exercise regularly as told by your health care provider. This may include:  Doing strength training two times a week, such as: ? Lifting weights. ? Using resistance bands. ? Push-ups. ? Sit-ups. ? Yoga.  Doing a certain intensity of exercise for a given amount of time. Choose from these options: ? A total of 150 minutes of moderate-intensity exercise every week. ? A total of 75 minutes of vigorous-intensity exercise every week. ? A mix of moderate-intensity and vigorous-intensity exercise every week. Children, pregnant women, people who have not exercised regularly, people who are overweight, and older adults may need to talk with a health care provider about what activities are safe to do. If you have a medical condition, be sure to talk with your health care provider before you start a new exercise program. What are some exercise ideas? Moderate-intensity exercise ideas include:  Walking 1 mile (1.6 km) in about 15  minutes.  Biking.  Hiking.  Golfing.  Dancing.  Water aerobics. Vigorous-intensity exercise ideas include:  Walking 4.5 miles (7.2 km) or more in about 1 hour.  Jogging or running 5 miles (8 km) in about 1 hour.  Biking 10 miles (16.1 km) or more in about 1 hour.  Lap swimming.  Roller-skating or in-line skating.  Cross-country skiing.  Vigorous competitive sports, such as football, basketball, and soccer.  Jumping rope.  Aerobic dancing. What are some everyday activities that can help me to get exercise?  Yard work, such as: ? Pushing a lawn mower. ? Raking and bagging leaves.  Washing your car.  Pushing a stroller.  Shoveling snow.  Gardening.  Washing windows or floors. How can I be more active in my day-to-day activities?  Use stairs instead of an elevator.  Take a walk during your lunch break.  If you drive, park your car farther away from your work or school.  If you take public transportation, get off one stop early and walk the rest of the way.  Stand up or walk around during all of your indoor phone calls.  Get up, stretch, and walk around every 30 minutes throughout the day.  Enjoy exercise with a friend. Support to continue exercising will help you keep a regular routine of activity. What guidelines can I follow while exercising?  Before you start a new exercise program, talk with your health care provider.  Do not exercise so much that you hurt yourself, feel dizzy, or get very short of breath.  Wear comfortable clothes and wear shoes with good support.  Drink plenty of water while you exercise to prevent dehydration or heat stroke.  Work out until your breathing   and your heartbeat get faster. Where to find more information  [place] Department of Health and Human Services: www.hhs.gov  Centers for Disease Control and Prevention (CDC): www.cdc.gov Summary  Exercising regularly is important. It will improve your overall fitness,  flexibility, and endurance.  Regular exercise also will improve your overall health. It can help you control your weight, reduce stress, and improve your bone density.  Do not exercise so much that you hurt yourself, feel dizzy, or get very short of breath.  Before you start a new exercise program, talk with your health care provider. This information is not intended to replace advice given to you by your health care provider. Make sure you discuss any questions you have with your health care provider. Document Revised: 01/09/2017 Document Reviewed: 12/18/2016 Elsevier Patient Education  2020 [employer]  

## 2019-02-22 NOTE — Progress Notes (Signed)
Subjective:    Patient ID: Pamela [name], female    DOB: 1960-05-21, 59 y.o.   MRN: [id number]   Chief Complaint: Medical Management of Chronic Issues    HPI:  1. Essential hypertension No c/o chest pain, sob or headache. Does not check blood pressure at home. Is doing diet control  BP Readings from Last 3 Encounters:  02/22/19 (!) 142/88  02/16/18 130/88  09/16/16 134/86     2. Pure hypercholesterolemia Tries to watch diet and does every little exercise. Lab Results  Component Value Date   CHOL 188 09/16/2016   HDL 63 09/16/2016   LDLCALC 111 (H) 09/16/2016   TRIG 71 09/16/2016   CHOLHDL 3.0 09/16/2016     3. Hypothyroidism, unspecified type No problems that she is aware of. Lab Results  Component Value Date   TSH 3.330 09/16/2016    4. Gastroesophageal reflux disease, unspecified whether esophagitis present Is on no regular meds. Just takes OTC meds as needed  5. BMI 34.0-34.9,adult Weight is down 3 lbs   Wt Readings from Last 3 Encounters:  02/22/19 174 lb (78.9 kg)  02/16/18 177 lb (80.3 kg)  09/16/16 165 lb (74.8 kg)   BMI Readings from Last 3 Encounters:  02/22/19 33.98 kg/m  02/16/18 34.57 kg/m  09/16/16 32.22 kg/m       Outpatient Encounter Medications as of 02/22/2019  Medication Sig  . levothyroxine (SYNTHROID, LEVOTHROID) 25 MCG tablet Take 1 tablet (25 mcg total) by mouth daily before breakfast. (Needs to be seen) (Patient not taking: Reported on 02/22/2019)  . Vitamin D, Ergocalciferol, (DRISDOL) 1.25 MG (50000 UNIT) CAPS capsule Take 50,000 Units by mouth every 7 (seven) days.  . [DISCONTINUED] methocarbamol (ROBAXIN) 500 MG tablet Take 1-2 tablets (500-1,000 mg total) by mouth every 8 (eight) hours as needed for muscle spasms.     Past Surgical History:  Procedure Laterality Date  . CESAREAN SECTION    . COLONOSCOPY N/A 07/30/2012   Dr. Oneida Alar: 3 simple adenomas, surveillance 2017  . COLONOSCOPY N/A 09/05/2015   Procedure:  COLONOSCOPY;  Surgeon: Danie Binder, MD;  Location: [facility];  Service: Endoscopy;  Laterality: N/A;  115  . POLYPECTOMY  09/05/2015   Procedure: POLYPECTOMY;  Surgeon: Danie Binder, MD;  Location: [facility];  Service: Endoscopy;;  Sigmoid colon polyp and Rectal polyps x 3 removed via cold forceps  . TUBAL LIGATION      Family History  Problem Relation Age of Onset  . Diabetes Mother   . Heart disease Mother   . Colon cancer Neg Hx   . Colon polyps Neg Hx     New complaints: None today  Social history: Lives with husbands. 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  HENT: Negative.   Eyes: Negative for pain.  Respiratory: Negative for shortness of breath.   Cardiovascular: Negative for chest pain, palpitations and leg swelling.  Gastrointestinal: Negative for abdominal pain.  Endocrine: Negative for polydipsia.  Skin: Negative for rash.  Neurological: Negative for dizziness, weakness and headaches.  Hematological: Does not bruise/bleed easily.  Psychiatric/Behavioral: Negative.   All other systems reviewed and are negative.      Objective:   Physical Exam Vitals and nursing note reviewed.  Constitutional:      General: She is not in acute distress.    Appearance: Normal appearance. She is well-developed.  HENT:     Head:  Normocephalic.     Nose: Nose normal.  Eyes:     Pupils: Pupils are equal, round, and reactive to [name].  Neck:     Vascular: No carotid bruit or JVD.  Cardiovascular:     Rate and Rhythm: Normal rate and regular rhythm.     Heart sounds: Normal heart sounds.  Pulmonary:     Effort: Pulmonary effort is normal. No respiratory distress.     Breath sounds: Normal breath sounds. No wheezing or rales.  Chest:     Chest wall: No tenderness.  Abdominal:     General: Bowel sounds are normal. There is no distension or abdominal bruit.      Palpations: Abdomen is soft. There is no hepatomegaly, splenomegaly, mass or pulsatile mass.     Tenderness: There is no abdominal tenderness.  Musculoskeletal:        General: Normal range of motion.     Cervical back: Normal range of motion and neck supple.  Lymphadenopathy:     Cervical: No cervical adenopathy.  Skin:    General: Skin is warm and dry.  Neurological:     Mental Status: She is alert and oriented to person, place, and time.     Deep Tendon Reflexes: Reflexes are normal and symmetric.  Psychiatric:        Behavior: Behavior normal.        Thought Content: Thought content normal.        Judgment: Judgment normal.    BP 136/82 (BP Location: Left Arm, Cuff Size: Normal)   Pulse 71   Temp (!) 97.5 F (36.4 C) (Temporal)   Resp 20   Ht 5' (1.524 m)   Wt 174 lb (78.9 kg)   SpO2 97%   BMI 33.98 kg/m         Assessment & Plan:  Pamela [name] comes in today with chief complaint of Medical Management of Chronic Issues   Diagnosis and orders addressed:  1. Essential hypertension Low sodium diet - CMP14+EGFR  2. Pure hypercholesterolemia Low fat diet - Lipid panel  3. Hypothyroidism, unspecified type - Thyroid Panel With TSH - levothyroxine (SYNTHROID) 25 MCG tablet; Take 1 tablet (25 mcg total) by mouth daily before breakfast. (Needs to be seen)  Dispense: 90 tablet; Refill: 1  4. Gastroesophageal reflux disease, unspecified whether esophagitis present Avoid spicy foods Do not eat 2 hours prior to bedtime  5. BMI 33.0-33.9,adult Discussed diet and exercise for person with BMI >25 Will recheck weight in 3-6 months   Mammogram scheduled for end of january Labs pending Health Maintenance reviewed Diet and exercise encouraged  Follow up plan: 6 months   [place], FNP

## 2019-02-23 LAB — CMP14+EGFR
ALT: 34 IU/L — ABNORMAL HIGH (ref 0–32)
AST: 28 IU/L (ref 0–40)
Albumin/Globulin Ratio: 1.7 (ref 1.2–2.2)
Albumin: 4.1 g/dL (ref 3.8–4.9)
Alkaline Phosphatase: 74 IU/L (ref 39–117)
BUN/Creatinine Ratio: 13 (ref 9–23)
BUN: 10 mg/dL (ref 6–24)
Bilirubin Total: 0.4 mg/dL (ref 0.0–1.2)
CO2: 25 mmol/L (ref 20–29)
Calcium: 9.4 mg/dL (ref 8.7–10.2)
Chloride: 105 mmol/L (ref 96–106)
Creatinine, Ser: 0.77 mg/dL (ref 0.57–1.00)
GFR calc Af Amer: 98 mL/min/{1.73_m2} (ref >59)
GFR calc non Af Amer: 85 mL/min/{1.73_m2} (ref >59)
Globulin, Total: 2.4 g/dL (ref 1.5–4.5)
Glucose: 98 mg/dL (ref 65–99)
Potassium: 3.8 mmol/L (ref 3.5–5.2)
Sodium: 143 mmol/L (ref 134–144)
Total Protein: 6.5 g/dL (ref 6.0–8.5)

## 2019-02-23 LAB — LIPID PANEL
Chol/HDL Ratio: 3.9 ratio (ref 0.0–4.4)
Cholesterol, Total: 220 mg/dL — ABNORMAL HIGH (ref 100–199)
HDL: 57 mg/dL (ref >39)
LDL Chol Calc (NIH): 143 mg/dL — ABNORMAL HIGH (ref 0–99)
Triglycerides: 115 mg/dL (ref 0–149)
VLDL Cholesterol Cal: 20 mg/dL (ref 5–40)

## 2019-02-23 LAB — THYROID PANEL WITH TSH
Free Thyroxine Index: 1.6 (ref 1.2–4.9)
T3 Uptake Ratio: 22 % — ABNORMAL LOW (ref 24–39)
T4, Total: 7.3 ug/dL (ref 4.5–12.0)
TSH: 3.57 u[IU]/mL (ref 0.450–4.500)

## 2019-03-09 DIAGNOSIS — Z1231 Encounter for screening mammogram for malignant neoplasm of breast: Secondary | ICD-10-CM | POA: Diagnosis not present

## 2019-08-17 ENCOUNTER — Other Ambulatory Visit: Payer: Self-pay | Admitting: Nurse Practitioner

## 2019-08-17 DIAGNOSIS — E039 Hypothyroidism, unspecified: Secondary | ICD-10-CM

## 2019-08-24 ENCOUNTER — Ambulatory Visit: Payer: Self-pay | Admitting: Nurse Practitioner

## 2020-02-13 ENCOUNTER — Other Ambulatory Visit: Payer: Self-pay | Admitting: Nurse Practitioner

## 2020-02-13 DIAGNOSIS — E039 Hypothyroidism, unspecified: Secondary | ICD-10-CM

## 2020-03-08 ENCOUNTER — Other Ambulatory Visit: Payer: Self-pay | Admitting: Nurse Practitioner

## 2020-03-08 DIAGNOSIS — E039 Hypothyroidism, unspecified: Secondary | ICD-10-CM

## 2020-03-08 NOTE — Telephone Encounter (Signed)
MMM NTBS 30 days given 02/13/20. Last OV 02/2019

## 2020-03-08 NOTE — Telephone Encounter (Signed)
Called patient, no answer, left message to return call 

## 2020-08-07 ENCOUNTER — Encounter: Payer: Self-pay | Admitting: Internal Medicine

## 2020-12-05 ENCOUNTER — Other Ambulatory Visit: Payer: Self-pay | Admitting: Nurse Practitioner

## 2020-12-05 DIAGNOSIS — Z1231 Encounter for screening mammogram for malignant neoplasm of breast: Secondary | ICD-10-CM

## 2020-12-12 ENCOUNTER — Ambulatory Visit (INDEPENDENT_AMBULATORY_CARE_PROVIDER_SITE_OTHER): Payer: 59 | Admitting: Nurse Practitioner

## 2020-12-12 ENCOUNTER — Other Ambulatory Visit (HOSPITAL_COMMUNITY)
Admission: RE | Admit: 2020-12-12 | Discharge: 2020-12-12 | Disposition: A | Discharge status: Home / Self Care | Payer: 59 | Source: Ambulatory Visit | Attending: Nurse Practitioner | Admitting: Nurse Practitioner

## 2020-12-12 ENCOUNTER — Other Ambulatory Visit: Payer: Self-pay

## 2020-12-12 ENCOUNTER — Ambulatory Visit (INDEPENDENT_AMBULATORY_CARE_PROVIDER_SITE_OTHER): Payer: 59

## 2020-12-12 ENCOUNTER — Encounter: Payer: Self-pay | Admitting: Nurse Practitioner

## 2020-12-12 VITALS — BP 145/78 | HR 55 | Temp 98.0°F | Resp 20 | Ht 60.0 in | Wt 171.0 lb

## 2020-12-12 DIAGNOSIS — Z0000 Encounter for general adult medical examination without abnormal findings: Secondary | ICD-10-CM | POA: Diagnosis present

## 2020-12-12 DIAGNOSIS — Z0001 Encounter for general adult medical examination with abnormal findings: Secondary | ICD-10-CM

## 2020-12-12 DIAGNOSIS — I10 Essential (primary) hypertension: Secondary | ICD-10-CM | POA: Diagnosis not present

## 2020-12-12 DIAGNOSIS — E559 Vitamin D deficiency, unspecified: Secondary | ICD-10-CM

## 2020-12-12 DIAGNOSIS — K219 Gastro-esophageal reflux disease without esophagitis: Secondary | ICD-10-CM

## 2020-12-12 DIAGNOSIS — Z6832 Body mass index (BMI) 32.0-32.9, adult: Secondary | ICD-10-CM

## 2020-12-12 DIAGNOSIS — E7800 Pure hypercholesterolemia, unspecified: Secondary | ICD-10-CM | POA: Diagnosis not present

## 2020-12-12 DIAGNOSIS — E039 Hypothyroidism, unspecified: Secondary | ICD-10-CM | POA: Diagnosis not present

## 2020-12-12 DIAGNOSIS — R1319 Other dysphagia: Secondary | ICD-10-CM

## 2020-12-12 LAB — MICROSCOPIC EXAMINATION
Renal Epithel, UA: NONE SEEN /hpf
WBC, UA: NONE SEEN /hpf (ref 0–5)

## 2020-12-12 LAB — URINALYSIS, COMPLETE
Bilirubin, UA: NEGATIVE
Glucose, UA: NEGATIVE
Ketones, UA: NEGATIVE
Leukocytes,UA: NEGATIVE
Nitrite, UA: NEGATIVE
Protein,UA: NEGATIVE
Specific Gravity, UA: 1.015 (ref 1.005–1.030)
Urobilinogen, Ur: 0.2 mg/dL (ref 0.2–1.0)
pH, UA: 7 (ref 5.0–7.5)

## 2020-12-12 MED ORDER — VITAMIN D (ERGOCALCIFEROL) 1.25 MG (50000 UNIT) PO CAPS: 50000.0000 [IU] | ORAL_CAPSULE | ORAL | 1 refills | Status: DC

## 2020-12-12 MED ORDER — LEVOTHYROXINE SODIUM 25 MCG PO TABS: 25.0000 ug | ORAL_TABLET | Freq: Every day | ORAL | 1 refills | Status: DC

## 2020-12-12 MED ORDER — HYDROCHLOROTHIAZIDE 12.5 MG PO CAPS: 12.5000 mg | ORAL_CAPSULE | Freq: Every day | ORAL | 1 refills | Status: DC

## 2020-12-12 NOTE — Patient Instructions (Signed)

## 2020-12-12 NOTE — Progress Notes (Signed)
Subjective:    Patient ID: Pamela [name], female    DOB: 1960/02/25, 60 y.o.   MRN: [id number]   Chief Complaint: annual physical   HPI:  1. Annual physical exam Also pap today  2. Essential hypertension No c/o chest pain, sob or headache. Does not check blood pressure at home. BP Readings from Last 3 Encounters:  02/22/19 136/82  02/16/18 130/88  09/16/16 134/86     3. Pure hypercholesterolemia Doe stry to [facility] diet and stays busy but no dedicated exercise. Lab Results  Component Value Date   CHOL 220 (H) 02/22/2019   HDL 57 02/22/2019   LDLCALC 143 (H) 02/22/2019   TRIG 115 02/22/2019   CHOLHDL 3.9 02/22/2019     4. Acquired hypothyroidism No problems that Pamela [name] is aware of. Has been out of Pamela [name] meds for several months Lab Results  Component Value Date   TSH 3.570 02/22/2019     5. Gastroesophageal reflux disease, unspecified whether esophagitis present Only uses OTC meds when needed  6. Esophageal dysphagia Currently not having any issues  7. Vitamin D deficiency Is currently out of vitamin d   8. BMI 32.0-32.9,adult Weight is down 3 lbs  Wt Readings from Last 3 Encounters:  12/12/20 171 lb (77.6 kg)  02/22/19 174 lb (78.9 kg)  02/16/18 177 lb (80.3 kg)   BMI Readings from Last 3 Encounters:  12/12/20 33.40 kg/m  02/22/19 33.98 kg/m  02/16/18 34.57 kg/m       Outpatient Encounter Medications as of 12/12/2020  Medication Sig   levothyroxine (SYNTHROID) 25 MCG tablet Take 1 tablet (25 mcg total) by mouth daily before breakfast. (Needs to be seen before next refill)   Vitamin D, Ergocalciferol, (DRISDOL) 1.25 MG (50000 UNIT) CAPS capsule Take 50,000 Units by mouth every 7 (seven) days.   No facility-administered encounter medications on file as of 12/12/2020.    Past Surgical History:  Procedure Laterality Date   CESAREAN SECTION     COLONOSCOPY N/A 07/30/2012   Dr. Oneida Alar: 3 simple adenomas, surveillance 2017   COLONOSCOPY N/A 09/05/2015    Procedure: COLONOSCOPY;  Surgeon: Danie Binder, MD;  Location: [facility];  Service: Endoscopy;  Laterality: N/A;  115   POLYPECTOMY  09/05/2015   Procedure: POLYPECTOMY;  Surgeon: Danie Binder, MD;  Location: [facility];  Service: Endoscopy;;  Sigmoid colon polyp and Rectal polyps x 3 removed via cold forceps   TUBAL LIGATION      Family History  Problem Relation Age of Onset   Diabetes Mother    Heart disease Mother    Colon cancer Neg Hx    Colon polyps Neg Hx     New complaints: None today  Social history: Lives with husband  Controlled substance contract: n/a     Review of Systems  Constitutional:  Negative for diaphoresis.  Eyes:  Negative for pain.  Respiratory:  Negative for shortness of breath.   Cardiovascular:  Negative for chest pain, palpitations and leg swelling.  Gastrointestinal:  Negative for abdominal pain.  Endocrine: Negative for polydipsia.  Skin:  Negative for rash.  Neurological:  Negative for dizziness, weakness and headaches.  Hematological:  Does not bruise/bleed easily.  All other systems reviewed and are negative.     Objective:   Physical Exam Vitals and nursing note reviewed.  Constitutional:      General: Pamela [name] is not in acute distress.    Appearance: Normal appearance. Pamela [name] is well-developed.  HENT:  Head: Normocephalic.     Right Ear: Tympanic membrane normal.     Left Ear: Tympanic membrane normal.     Nose: Nose normal.     Mouth/Throat:     Mouth: Mucous membranes are moist.  Eyes:     Pupils: Pupils are equal, round, and reactive to [name].  Neck:     Vascular: No carotid bruit or JVD.  Cardiovascular:     Rate and Rhythm: Normal rate and regular rhythm.     Heart sounds: Normal heart sounds.  Pulmonary:     Effort: Pulmonary effort is normal. No respiratory distress.     Breath sounds: Normal breath sounds. No wheezing or rales.  Chest:     Chest wall: No tenderness.  Abdominal:     General: Bowel sounds are  normal. There is no distension or abdominal bruit.     Palpations: Abdomen is soft. There is no hepatomegaly, splenomegaly, mass or pulsatile mass.     Tenderness: There is no abdominal tenderness.  Genitourinary:    General: Normal vulva.     Vagina: No vaginal discharge.     Rectum: Normal.     Comments: Cervix parous and pink No adnexal masses or tenderness Musculoskeletal:        General: Normal range of motion.     Cervical back: Normal range of motion and neck supple.     Right lower leg: No edema.     Left lower leg: No edema.  Lymphadenopathy:     Cervical: No cervical adenopathy.  Skin:    General: Skin is warm and dry.  Neurological:     Mental Status: Pamela [name] is alert and oriented to person, place, and time.     Deep Tendon Reflexes: Reflexes are normal and symmetric.  Psychiatric:        Behavior: Behavior normal.        Thought Content: Thought content normal.        Judgment: Judgment normal.    BP (!) 145/78   Pulse (!) 55   Temp 98 F (36.7 C) (Temporal)   Resp 20   Ht 5' (1.524 m)   Wt 171 lb (77.6 kg)   SpO2 99%   BMI 33.40 kg/m   EKG- NSR-Preliminary reading by Ronnald Collum, FNP  [facility]  Chest xray- no acute or chronic Riley Kill, FNP      Assessment & Plan:   Pamela [name] comes in today with chief complaint of Annual Exam   Diagnosis and orders addressed:  1. Annual physical exam Labs pending - Cytology - PAP - Urinalysis, Complete - EKG 12-Lead - DG Chest 2 View  2. Essential hypertension Low sodium diet' keep diary of blood pressure at home - CBC with Differential/Platelet - CMP14+EGFR - hydrochlorothiazide (MICROZIDE) 12.5 MG capsule; Take 1 capsule (12.5 mg total) by mouth daily.  Dispense: 90 capsule; Refill: 1  3. Pure hypercholesterolemia Low fat diet - Lipid panel - EKG 12-Lead - DG Chest 2 View  4. Acquired hypothyroidism Labs pending - Thyroid Panel With TSH - levothyroxine (SYNTHROID) 25 MCG tablet;  Take 1 tablet (25 mcg total) by mouth daily before breakfast. (Needs to be seen before next refill)  Dispense: 90 tablet; Refill: 1  5. Gastroesophageal reflux disease, unspecified whether esophagitis present Avoid spicy foods Do not eat 2 hours prior to bedtime  6. Esophageal dysphagia Chew food well  7. Vitamin D deficiency Back on vitamin d supplements  8. BMI 32.0-32.9,adult Discussed diet and exercise  for person with BMI >25 Will recheck weight in 3-6 months    Labs pending Health Maintenance reviewed- patient will schedule repeat colonoscopy Diet and exercise encouraged  Follow up plan: 3 months to check tsh    [place], FNP

## 2020-12-13 LAB — CMP14+EGFR
ALT: 32 IU/L (ref 0–32)
AST: 23 IU/L (ref 0–40)
Albumin/Globulin Ratio: 1.7 (ref 1.2–2.2)
Albumin: 4.1 g/dL (ref 3.8–4.9)
Alkaline Phosphatase: 69 IU/L (ref 44–121)
BUN/Creatinine Ratio: 16 (ref 12–28)
BUN: 12 mg/dL (ref 8–27)
Bilirubin Total: 0.5 mg/dL (ref 0.0–1.2)
CO2: 25 mmol/L (ref 20–29)
Calcium: 9.3 mg/dL (ref 8.7–10.3)
Chloride: 105 mmol/L (ref 96–106)
Creatinine, Ser: 0.74 mg/dL (ref 0.57–1.00)
Globulin, Total: 2.4 g/dL (ref 1.5–4.5)
Glucose: 95 mg/dL (ref 70–99)
Potassium: 3.3 mmol/L — ABNORMAL LOW (ref 3.5–5.2)
Sodium: 142 mmol/L (ref 134–144)
Total Protein: 6.5 g/dL (ref 6.0–8.5)
eGFR: 93 mL/min/{1.73_m2} (ref >59)

## 2020-12-13 LAB — CBC WITH DIFFERENTIAL/PLATELET
Basophils Absolute: 0 10*3/uL (ref 0.0–0.2)
Basos: 1 %
EOS (ABSOLUTE): 0.2 10*3/uL (ref 0.0–0.4)
Eos: 5 %
Hematocrit: 41.2 % (ref 34.0–46.6)
Hemoglobin: 13.7 g/dL (ref 11.1–15.9)
Immature Grans (Abs): 0 10*3/uL (ref 0.0–0.1)
Immature Granulocytes: 0 %
Lymphocytes Absolute: 0.9 10*3/uL (ref 0.7–3.1)
Lymphs: 20 %
MCH: 30.7 pg (ref 26.6–33.0)
MCHC: 33.3 g/dL (ref 31.5–35.7)
MCV: 92 fL (ref 79–97)
Monocytes Absolute: 0.3 10*3/uL (ref 0.1–0.9)
Monocytes: 6 %
Neutrophils Absolute: 3 10*3/uL (ref 1.4–7.0)
Neutrophils: 68 %
Platelets: 192 10*3/uL (ref 150–450)
RBC: 4.46 x10E6/uL (ref 3.77–5.28)
RDW: 12.1 % (ref 11.7–15.4)
WBC: 4.4 10*3/uL (ref 3.4–10.8)

## 2020-12-13 LAB — LIPID PANEL
Chol/HDL Ratio: 3.3 ratio (ref 0.0–4.4)
Cholesterol, Total: 207 mg/dL — ABNORMAL HIGH (ref 100–199)
HDL: 62 mg/dL (ref >39)
LDL Chol Calc (NIH): 127 mg/dL — ABNORMAL HIGH (ref 0–99)
Triglycerides: 100 mg/dL (ref 0–149)
VLDL Cholesterol Cal: 18 mg/dL (ref 5–40)

## 2020-12-13 LAB — THYROID PANEL WITH TSH
Free Thyroxine Index: 2.1 (ref 1.2–4.9)
T3 Uptake Ratio: 25 % (ref 24–39)
T4, Total: 8.4 ug/dL (ref 4.5–12.0)
TSH: 2.7 u[IU]/mL (ref 0.450–4.500)

## 2020-12-13 MED ORDER — ATORVASTATIN CALCIUM 40 MG PO TABS: 40.0000 mg | ORAL_TABLET | Freq: Every day | ORAL | 1 refills | Status: DC

## 2020-12-13 NOTE — Addendum Note (Signed)
Addended by: Chevis Pretty on: 12/13/2020 01:40 PM   Modules accepted: Orders

## 2020-12-17 LAB — CYTOLOGY - PAP
Comment: NEGATIVE
Diagnosis: NEGATIVE
High risk HPV: NEGATIVE

## 2021-03-18 ENCOUNTER — Ambulatory Visit: Payer: 59 | Admitting: Nurse Practitioner

## 2022-03-13 ENCOUNTER — Other Ambulatory Visit: Payer: Self-pay | Admitting: Nurse Practitioner

## 2022-03-13 DIAGNOSIS — Z1231 Encounter for screening mammogram for malignant neoplasm of breast: Secondary | ICD-10-CM

## 2022-03-17 ENCOUNTER — Ambulatory Visit
Admission: RE | Admit: 2022-03-17 | Discharge: 2022-03-17 | Disposition: A | Discharge status: Home / Self Care | Payer: PRIVATE HEALTH INSURANCE | Source: Ambulatory Visit | Attending: Nurse Practitioner | Admitting: Nurse Practitioner

## 2022-03-17 DIAGNOSIS — Z1231 Encounter for screening mammogram for malignant neoplasm of breast: Secondary | ICD-10-CM

## 2022-03-18 ENCOUNTER — Other Ambulatory Visit (HOSPITAL_COMMUNITY)
Admission: RE | Admit: 2022-03-18 | Discharge: 2022-03-18 | Disposition: A | Discharge status: Home / Self Care | Payer: PRIVATE HEALTH INSURANCE | Source: Ambulatory Visit | Attending: Nurse Practitioner | Admitting: Nurse Practitioner

## 2022-03-18 ENCOUNTER — Encounter: Payer: Self-pay | Admitting: Nurse Practitioner

## 2022-03-18 ENCOUNTER — Ambulatory Visit (INDEPENDENT_AMBULATORY_CARE_PROVIDER_SITE_OTHER): Payer: PRIVATE HEALTH INSURANCE | Admitting: Nurse Practitioner

## 2022-03-18 VITALS — BP 158/88 | HR 68 | Temp 98.7°F | Ht 60.0 in | Wt 169.4 lb

## 2022-03-18 DIAGNOSIS — E039 Hypothyroidism, unspecified: Secondary | ICD-10-CM | POA: Diagnosis not present

## 2022-03-18 DIAGNOSIS — E7800 Pure hypercholesterolemia, unspecified: Secondary | ICD-10-CM

## 2022-03-18 DIAGNOSIS — Z0000 Encounter for general adult medical examination without abnormal findings: Secondary | ICD-10-CM | POA: Insufficient documentation

## 2022-03-18 DIAGNOSIS — Z0001 Encounter for general adult medical examination with abnormal findings: Secondary | ICD-10-CM | POA: Diagnosis not present

## 2022-03-18 DIAGNOSIS — E559 Vitamin D deficiency, unspecified: Secondary | ICD-10-CM

## 2022-03-18 DIAGNOSIS — K219 Gastro-esophageal reflux disease without esophagitis: Secondary | ICD-10-CM

## 2022-03-18 DIAGNOSIS — I10 Essential (primary) hypertension: Secondary | ICD-10-CM | POA: Diagnosis not present

## 2022-03-18 DIAGNOSIS — Z6832 Body mass index (BMI) 32.0-32.9, adult: Secondary | ICD-10-CM

## 2022-03-18 LAB — LIPID PANEL

## 2022-03-18 MED ORDER — ATORVASTATIN CALCIUM 40 MG PO TABS: 40.0000 mg | ORAL_TABLET | Freq: Every day | ORAL | 1 refills | Status: AC

## 2022-03-18 MED ORDER — HYDROCHLOROTHIAZIDE 12.5 MG PO CAPS: 12.5000 mg | ORAL_CAPSULE | Freq: Every day | ORAL | 1 refills | Status: AC

## 2022-03-18 MED ORDER — VITAMIN D (ERGOCALCIFEROL) 1.25 MG (50000 UNIT) PO CAPS: 50000.0000 [IU] | ORAL_CAPSULE | ORAL | 1 refills | Status: AC

## 2022-03-18 MED ORDER — LEVOTHYROXINE SODIUM 25 MCG PO TABS: 25.0000 ug | ORAL_TABLET | Freq: Every day | ORAL | 1 refills | Status: AC

## 2022-03-18 NOTE — Patient Instructions (Signed)
Bone Health Bones protect organs, store calcium, anchor muscles, and support the whole body. Keeping your bones strong is important, especially as you get older. You can take actions to help keep your bones strong and healthy. Why is keeping my bones healthy important?  Keeping your bones healthy is important because your body constantly replaces bone cells. Cells get old, and new cells take their place. As we age, we lose bone cells because the body may not be able to make enough new cells to replace the old cells. The amount of bone cells and bone tissue you have is referred to as bone mass. The higher your bone mass, the stronger your bones. The aging process leads to an overall loss of bone mass in the body, which can increase the likelihood of: Broken bones. A condition in which the bones become weak and brittle (osteoporosis). A large decline in bone mass occurs in older adults. In women, it occurs about the time of menopause. What actions can I take to keep my bones healthy? Good health habits are important for maintaining healthy bones. This includes eating nutritious foods and exercising regularly. To have healthy bones, you need to get enough of the right minerals and vitamins. Most nutrition experts recommend getting these nutrients from the foods that you eat. In some cases, taking supplements may also be recommended. Doing certain types of exercise is also important for bone health. What are the nutritional recommendations for healthy bones?  Eating a well-balanced diet with plenty of calcium and vitamin D will help to protect your bones. Nutritional recommendations vary from person to person. Ask your health care provider what is healthy for you. Here are some general guidelines. Get enough calcium Calcium is the most important (essential) mineral for bone health. Most people can get enough calcium from their diet, but supplements may be recommended for people who are at risk for  osteoporosis. Good sources of calcium include: Dairy products, such as low-fat or nonfat milk, cheese, and yogurt. Dark green leafy vegetables, such as bok choy and broccoli. Foods that have calcium added to them (are fortified). Foods that may be fortified with calcium include orange juice, cereal, bread, soy beverages, and tofu products. Nuts, such as almonds. Follow these recommended amounts for daily calcium intake: Infants, 0-6 months: 200 mg. Infants, 6-12 months: 260 mg. Children, age 1-3: 700 mg. Children, age 4-8: 1,000 mg. Children, age 9-13: 1,300 mg. Teens, age 14-18: 1,300 mg. Adults, age 19-50: 1,000 mg. Adults, age 51-70: Men: 1,000 mg. Women: 1,200 mg. Adults, age 71 or older: 1,200 mg. Pregnant and breastfeeding females: Teens: 1,300 mg. Adults: 1,000 mg. Get enough vitamin D Vitamin D is the most essential vitamin for bone health. It helps the body absorb calcium. Sunlight stimulates the skin to make vitamin D, so be sure to get enough sunlight. If you live in a cold climate or you do not get outside often, your health care provider may recommend that you take vitamin D supplements. Good sources of vitamin D in your diet include: Egg yolks. Saltwater fish. Milk and cereal fortified with vitamin D. Follow these recommended amounts for daily vitamin D intake: Infants, 0-12 months: 400 international units (IU). Children and teens, age 1-18: 600 international units. Adults, age 59 or younger: 600 international units. Adults, age 60 or older: 600-1,000 international units. Get other important nutrients Other nutrients that are important for bone health include: Phosphorus. This mineral is found in meat, poultry, dairy foods, nuts, and legumes. The   recommended daily intake for adult men and adult women is 700 mg. Magnesium. This mineral is found in seeds, nuts, dark green vegetables, and legumes. The recommended daily intake for adult men is 400-420 mg. For adult women,  it is 310-320 mg. Vitamin K. This vitamin is found in green leafy vegetables. The recommended daily intake is 120 mcg for adult men and 90 mcg for adult women. What type of physical activity is best for building and maintaining healthy bones? Weight-bearing and strength-building activities are important for building and maintaining healthy bones. Weight-bearing activities cause muscles and bones to work against gravity. Strength-building activities increase the strength of the muscles that support bones. Weight-bearing and muscle-building activities include: Walking and hiking. Jogging and running. Dancing. Gym exercises. Lifting weights. Tennis and racquetball. Climbing stairs. Aerobics. Adults should get at least 30 minutes of moderate physical activity on most days. Children should get at least 60 minutes of moderate physical activity on most days. Ask your health care provider what type of exercise is best for you. How can I find out if my bone mass is low? Bone mass can be measured with an X-ray test called a bone mineral density (BMD) test. This test is recommended for all women who are age 65 or older. It may also be recommended for: Men who are age 70 or older. People who are at risk for osteoporosis because of: Having a long-term disease that weakens bones, such as kidney disease or rheumatoid arthritis. Having menopause earlier than normal. Taking medicine that weakens bones, such as steroids, thyroid hormones, or hormone treatment for breast cancer or prostate cancer. Smoking. Drinking three or more alcoholic drinks a day. Being underweight. Sedentary lifestyle. If you find that you have a low bone mass, you may be able to prevent osteoporosis or further bone loss by changing your diet and lifestyle. Where can I find more information? Bone Health & Osteoporosis Foundation: www.nof.org/patients National Institutes of Health: www.bones.nih.gov International Osteoporosis  Foundation: www.iofbonehealth.org Summary The aging process leads to an overall loss of bone mass in the body, which can increase the likelihood of broken bones and osteoporosis. Eating a well-balanced diet with plenty of calcium and vitamin D will help to protect your bones. Weight-bearing and strength-building activities are also important for building and maintaining strong bones. Bone mass can be measured with an X-ray test called a bone mineral density (BMD) test. This information is not intended to replace advice given to you by your health care provider. Make sure you discuss any questions you have with your health care provider. Document Revised: 07/11/2020 Document Reviewed: 07/11/2020 Elsevier Patient Education  2023 [employer]  

## 2022-03-18 NOTE — Progress Notes (Signed)
Subjective:    Patient ID: Pamela [name], female    DOB: May 21, 1960, 62 y.o.   MRN: [id number]   Chief Complaint: Annual Exam    HPI:  Pamela [name] is a 62 y.o. who identifies as a female who was assigned female at birth.   Social history: Lives with: her uncle lives with her and her husband Work history: [employer]   Comes in today for follow up of the following chronic medical issues:  1. Annual physical exam Pap also- LMP > 10 years  2. Essential hypertension No c/o chest , sob or headache. Does not check blood pressure at home. BP Readings from Last 3 Encounters:  03/18/22 (!) 158/88  12/12/20 (!) 145/78  02/22/19 136/82     3. Acquired hypothyroidism No issues that she is awareof. Lab Results  Component Value Date   TSH 2.700 12/12/2020     4. Gastroesophageal reflux disease, unspecified whether esophagitis present Very seldom has issues. Just uses OTC meds if needed.  5. Pure hypercholesterolemia Tries to watch diet. Walks occasionally Lab Results  Component Value Date   CHOL 207 (H) 12/12/2020   HDL 62 12/12/2020   LDLCALC 127 (H) 12/12/2020   TRIG 100 12/12/2020   CHOLHDL 3.3 12/12/2020     6. Vitamin D deficiency Is no longer on vitamind supplement  7. BMI 32.0-32.9,adult No recent weight changes Wt Readings from Last 3 Encounters:  03/18/22 169 lb 6.4 oz (76.8 kg)  12/12/20 171 lb (77.6 kg)  02/22/19 174 lb (78.9 kg)   BMI Readings from Last 3 Encounters:  03/18/22 33.08 kg/m  12/12/20 33.40 kg/m  02/22/19 33.98 kg/m      New complaints: Nothing  today  Allergies  Allergen Reactions   Iodine Hives   Outpatient Encounter Medications as of 03/18/2022  Medication Sig   atorvastatin (LIPITOR) 40 MG tablet Take 1 tablet (40 mg total) by mouth daily. (Patient not taking: Reported on 03/18/2022)   hydrochlorothiazide (MICROZIDE) 12.5 MG capsule Take 1 capsule (12.5 mg total) by mouth daily. (Patient not taking: Reported on 03/18/2022)    levothyroxine (SYNTHROID) 25 MCG tablet Take 1 tablet (25 mcg total) by mouth daily before breakfast. (Needs to be seen before next refill) (Patient not taking: Reported on 03/18/2022)   Vitamin D, Ergocalciferol, (DRISDOL) 1.25 MG (50000 UNIT) CAPS capsule Take 1 capsule (50,000 Units total) by mouth every 7 (seven) days. (Patient not taking: Reported on 03/18/2022)   No facility-administered encounter medications on file as of 03/18/2022.    Past Surgical History:  Procedure Laterality Date   CESAREAN SECTION     COLONOSCOPY N/A 07/30/2012   Dr. Oneida Alar: 3 simple adenomas, surveillance 2017   COLONOSCOPY N/A 09/05/2015   Procedure: COLONOSCOPY;  Surgeon: Danie Binder, MD;  Location: [facility];  Service: Endoscopy;  Laterality: N/A;  115   POLYPECTOMY  09/05/2015   Procedure: POLYPECTOMY;  Surgeon: Danie Binder, MD;  Location: [facility];  Service: Endoscopy;;  Sigmoid colon polyp and Rectal polyps x 3 removed via cold forceps   TUBAL LIGATION      Family History  Problem Relation Age of Onset   Diabetes Mother    Heart disease Mother    Colon cancer Neg Hx    Colon polyps Neg Hx       Controlled substance contract: n/a     Review of Systems  Constitutional:  Negative for diaphoresis.  Eyes:  Negative for pain.  Respiratory:  Negative for shortness  of breath.   Cardiovascular:  Negative for chest pain, palpitations and leg swelling.  Gastrointestinal:  Negative for abdominal pain.  Endocrine: Negative for polydipsia.  Skin:  Negative for rash.  Neurological:  Negative for dizziness, weakness and headaches.  Hematological:  Does not bruise/bleed easily.  All other systems reviewed and are negative.      Objective:   Physical Exam Vitals and nursing note reviewed.  Constitutional:      General: She is not in acute distress.    Appearance: Normal appearance. She is well-developed.  HENT:     Head: Normocephalic.     Right Ear: Tympanic membrane normal.     Left  Ear: Tympanic membrane normal.     Nose: Nose normal.     Mouth/Throat:     Mouth: Mucous membranes are moist.  Eyes:     Pupils: Pupils are equal, round, and reactive to [name].  Neck:     Vascular: No carotid bruit or JVD.  Cardiovascular:     Rate and Rhythm: Normal rate and regular rhythm.     Heart sounds: Normal heart sounds.  Pulmonary:     Effort: Pulmonary effort is normal. No respiratory distress.     Breath sounds: Normal breath sounds. No wheezing or rales.  Chest:     Chest wall: No tenderness.  Abdominal:     General: Bowel sounds are normal. There is no distension or abdominal bruit.     Palpations: Abdomen is soft. There is no hepatomegaly, splenomegaly, mass or pulsatile mass.     Tenderness: There is no abdominal tenderness.  Genitourinary:    General: Normal vulva.     Vagina: No vaginal discharge.     Rectum: Normal.     Comments: Cervix parous and pink No adnexal masses or tenderness Musculoskeletal:        General: Normal range of motion.     Cervical back: Normal range of motion and neck supple.  Lymphadenopathy:     Cervical: No cervical adenopathy.  Skin:    General: Skin is warm and dry.  Neurological:     Mental Status: She is alert and oriented to person, place, and time.     Deep Tendon Reflexes: Reflexes are normal and symmetric.  Psychiatric:        Behavior: Behavior normal.        Thought Content: Thought content normal.        Judgment: Judgment normal.     BP (!) 158/88   Pulse 68   Temp 98.7 F (37.1 C)   Ht 5' (1.524 m)   Wt 169 lb 6.4 oz (76.8 kg)   SpO2 97%   BMI 33.08 kg/m        Assessment & Plan:   Pamela [name] comes in today with chief complaint of Annual Exam   Diagnosis and orders addressed:  1. Annual physical exam - Cytology - PAP  2. Essential hypertension Low sodium diet - CMP14+EGFR - CBC with Differential - hydrochlorothiazide (MICROZIDE) 12.5 MG capsule; Take 1 capsule (12.5 mg total) by mouth  daily.  Dispense: 90 capsule; Refill: 1  3. Acquired hypothyroidism Labs pending - Thyroid Panel With TSH - levothyroxine (SYNTHROID) 25 MCG tablet; Take 1 tablet (25 mcg total) by mouth daily before breakfast. (Needs to be seen before next refill)  Dispense: 90 tablet; Refill: 1  4. Gastroesophageal reflux disease, unspecified whether esophagitis present Avoid spicy foods Do not eat 2 hours prior to bedtime   5.  Pure hypercholesterolemia Low fat diet - Lipid Panel - atorvastatin (LIPITOR) 40 MG tablet; Take 1 tablet (40 mg total) by mouth daily.  Dispense: 90 tablet; Refill: 1  6. Vitamin D deficiency Labs pending - VITAMIN D 25 Hydroxy (Vit-D Deficiency, Fractures) - Vitamin D, Ergocalciferol, (DRISDOL) 1.25 MG (50000 UNIT) CAPS capsule; Take 1 capsule (50,000 Units total) by mouth every 7 (seven) days.  Dispense: 12 capsule; Refill: 1  7. BMI 32.0-32.9,adult Discussed diet and exercise for person with BMI >25 Will recheck weight in 3-6 months    Labs pending Health Maintenance reviewed Diet and exercise encouraged  Follow up plan: 6 months   Mary-Margaret Hassell Done, FNP

## 2022-03-19 LAB — CMP14+EGFR
ALT: 26 IU/L (ref 0–32)
AST: 24 IU/L (ref 0–40)
Albumin/Globulin Ratio: 1.6 (ref 1.2–2.2)
Albumin: 4.2 g/dL (ref 3.9–4.9)
Alkaline Phosphatase: 76 IU/L (ref 44–121)
BUN/Creatinine Ratio: 14 (ref 12–28)
BUN: 11 mg/dL (ref 8–27)
Bilirubin Total: 0.4 mg/dL (ref 0.0–1.2)
CO2: 25 mmol/L (ref 20–29)
Calcium: 10 mg/dL (ref 8.7–10.3)
Chloride: 102 mmol/L (ref 96–106)
Creatinine, Ser: 0.77 mg/dL (ref 0.57–1.00)
Globulin, Total: 2.7 g/dL (ref 1.5–4.5)
Glucose: 93 mg/dL (ref 70–99)
Potassium: 3.8 mmol/L (ref 3.5–5.2)
Sodium: 139 mmol/L (ref 134–144)
Total Protein: 6.9 g/dL (ref 6.0–8.5)
eGFR: 88 mL/min/{1.73_m2} (ref >59)

## 2022-03-19 LAB — THYROID PANEL WITH TSH
Free Thyroxine Index: 1.9 (ref 1.2–4.9)
T3 Uptake Ratio: 24 % (ref 24–39)
T4, Total: 7.8 ug/dL (ref 4.5–12.0)
TSH: 4.37 u[IU]/mL (ref 0.450–4.500)

## 2022-03-19 LAB — LIPID PANEL
Chol/HDL Ratio: 3.3 ratio (ref 0.0–4.4)
Cholesterol, Total: 230 mg/dL — ABNORMAL HIGH (ref 100–199)
HDL: 70 mg/dL (ref >39)
LDL Chol Calc (NIH): 143 mg/dL — ABNORMAL HIGH (ref 0–99)
Triglycerides: 97 mg/dL (ref 0–149)
VLDL Cholesterol Cal: 17 mg/dL (ref 5–40)

## 2022-03-19 LAB — CBC WITH DIFFERENTIAL/PLATELET
Basophils Absolute: 0.1 10*3/uL (ref 0.0–0.2)
Basos: 1 %
EOS (ABSOLUTE): 0.2 10*3/uL (ref 0.0–0.4)
Eos: 3 %
Hematocrit: 42 % (ref 34.0–46.6)
Hemoglobin: 14.3 g/dL (ref 11.1–15.9)
Immature Grans (Abs): 0 10*3/uL (ref 0.0–0.1)
Immature Granulocytes: 0 %
Lymphocytes Absolute: 1.1 10*3/uL (ref 0.7–3.1)
Lymphs: 22 %
MCH: 30.6 pg (ref 26.6–33.0)
MCHC: 34 g/dL (ref 31.5–35.7)
MCV: 90 fL (ref 79–97)
Monocytes Absolute: 0.3 10*3/uL (ref 0.1–0.9)
Monocytes: 6 %
Neutrophils Absolute: 3.3 10*3/uL (ref 1.4–7.0)
Neutrophils: 68 %
Platelets: 200 10*3/uL (ref 150–450)
RBC: 4.67 x10E6/uL (ref 3.77–5.28)
RDW: 12.4 % (ref 11.7–15.4)
WBC: 4.9 10*3/uL (ref 3.4–10.8)

## 2022-03-19 LAB — VITAMIN D 25 HYDROXY (VIT D DEFICIENCY, FRACTURES): Vit D, 25-Hydroxy: 22.4 ng/mL — ABNORMAL LOW (ref 30.0–100.0)

## 2022-03-21 LAB — CYTOLOGY - PAP: Diagnosis: NEGATIVE

## 2022-09-16 ENCOUNTER — Ambulatory Visit: Payer: PRIVATE HEALTH INSURANCE | Admitting: Nurse Practitioner

## 2023-01-06 IMAGING — DX DG CHEST 2V
2 series · 2 of 2 positions shown · non-contrast
Comparison: 09/03/2015

CLINICAL DATA: Annual physical exam, hypercholesterolemia

EXAM:
CHEST - 2 VIEW

[chest pa]
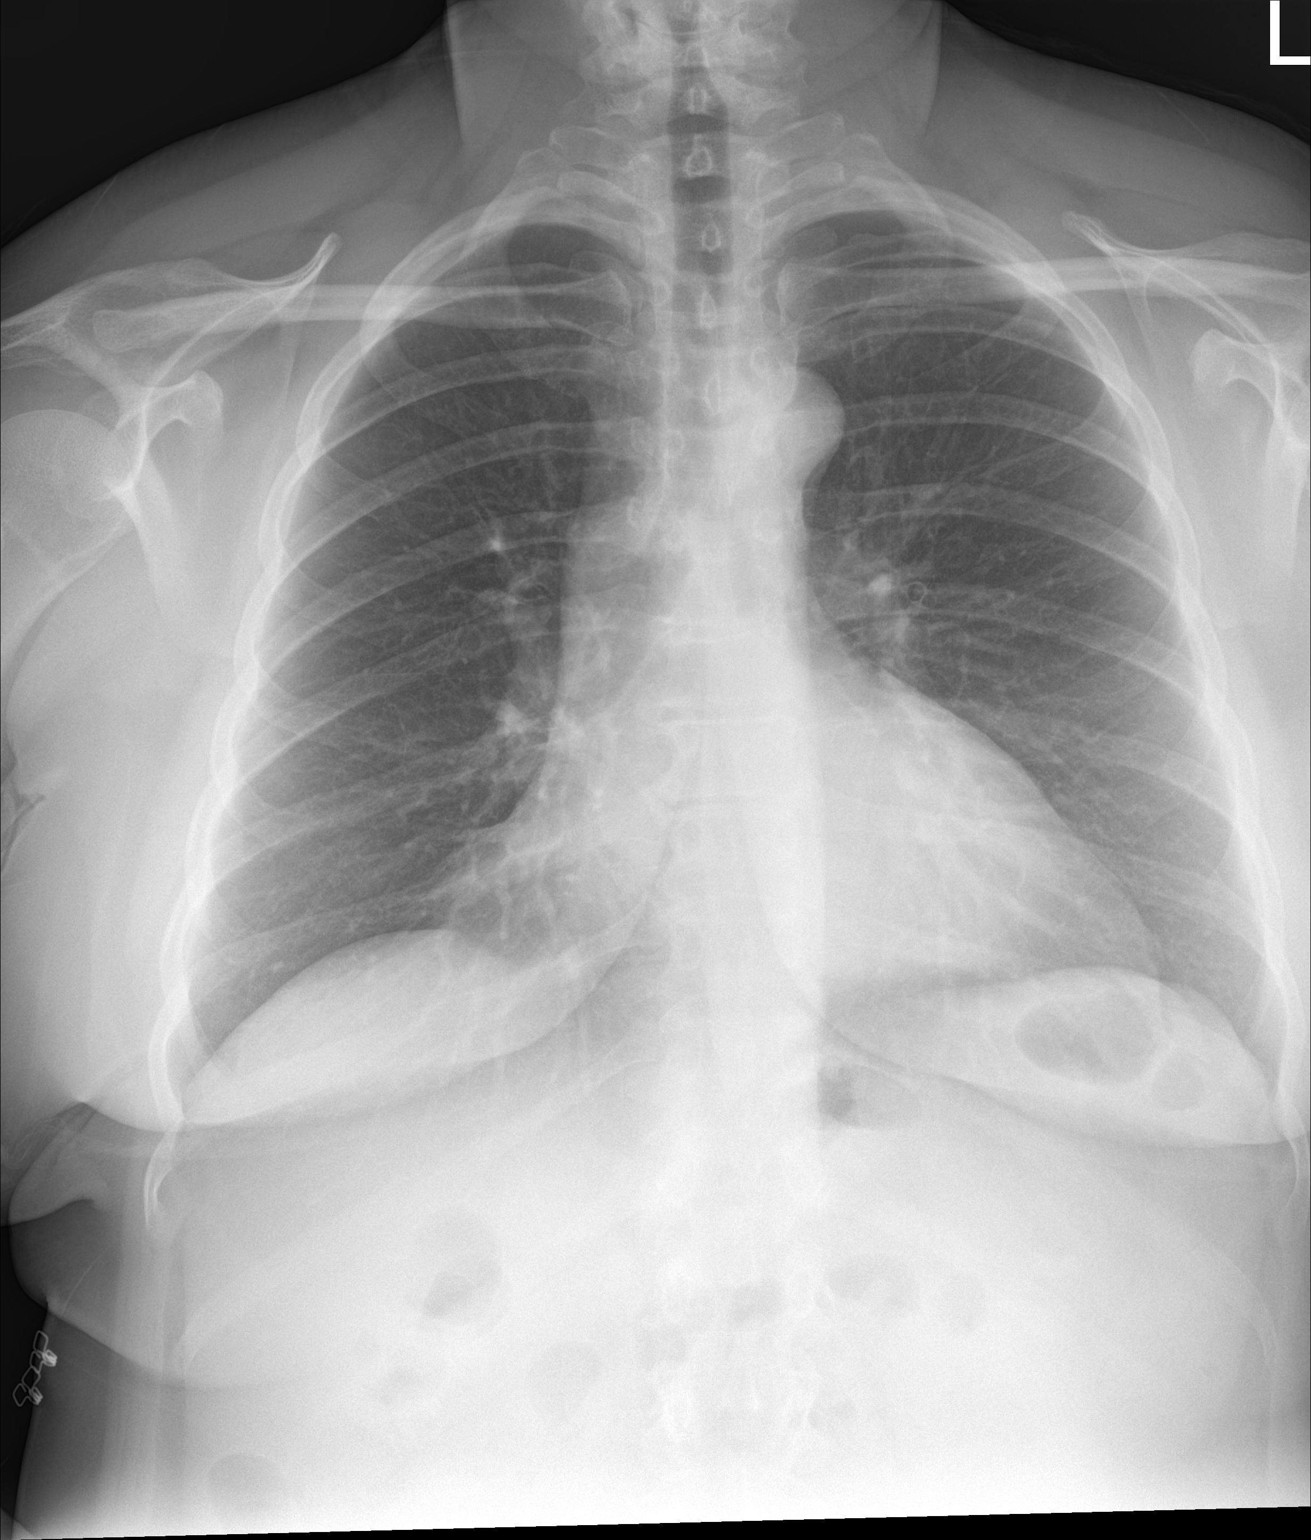

[chest lat]
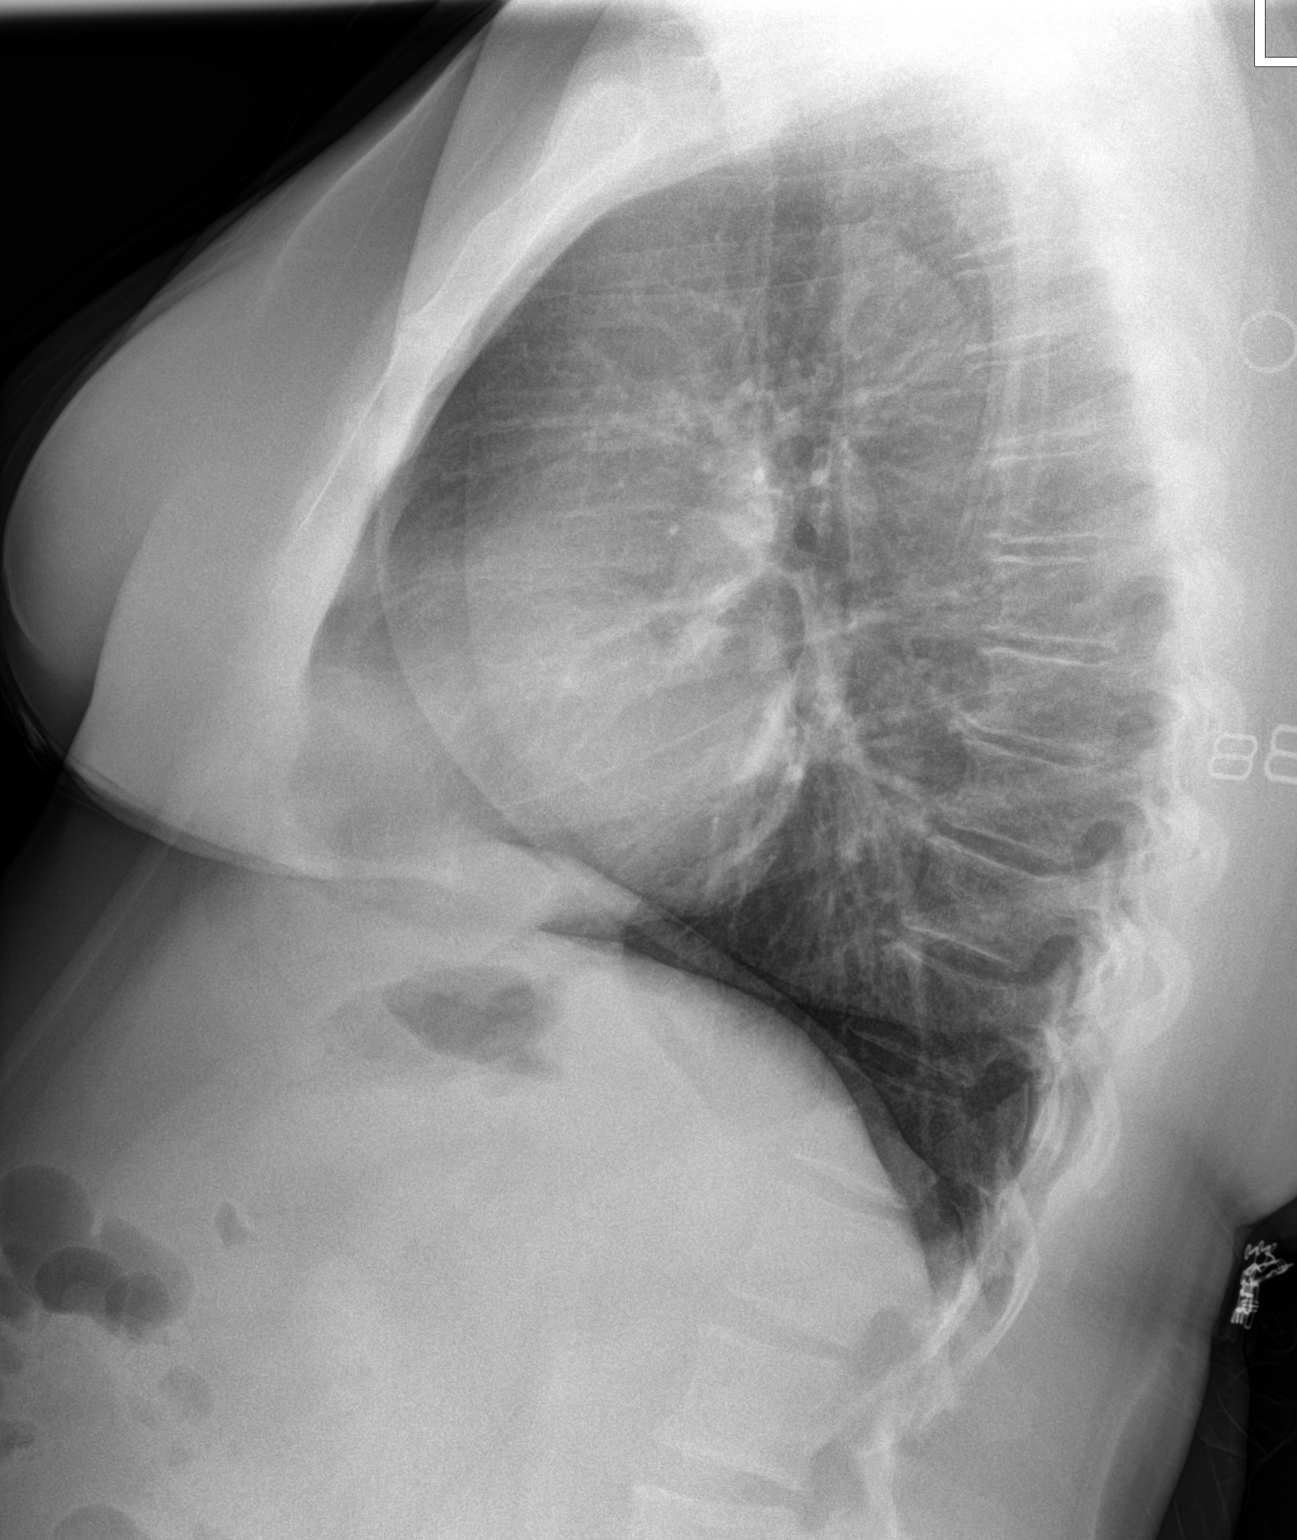

[2 of 2 positions shown; findings below may reference images not displayed]

FINDINGS: Lungs are clear.

Heart size and mediastinal contours are within normal limits.

No effusion.

Visualized bones unremarkable.
IMPRESSION: No acute cardiopulmonary disease.
